# Patient Record
Sex: Male | Born: 1970
Health system: Southern US, Community
[De-identification: ages and names within clinical notes are randomized; demographics above are authoritative.]

## PROBLEM LIST (undated history)

## (undated) DIAGNOSIS — G43909 Migraine, unspecified, not intractable, without status migrainosus: Secondary | ICD-10-CM

---

## 2010-12-20 ENCOUNTER — Emergency Department (INDEPENDENT_AMBULATORY_CARE_PROVIDER_SITE_OTHER): Payer: Self-pay

## 2010-12-20 ENCOUNTER — Other Ambulatory Visit: Payer: Self-pay

## 2010-12-20 ENCOUNTER — Emergency Department (HOSPITAL_BASED_OUTPATIENT_CLINIC_OR_DEPARTMENT_OTHER)
Admission: EM | Admit: 2010-12-20 | Discharge: 2010-12-20 | Disposition: A | Discharge status: Home / Self Care | Payer: Self-pay | Attending: Emergency Medicine | Admitting: Emergency Medicine

## 2010-12-20 ENCOUNTER — Encounter: Payer: Self-pay | Admitting: *Deleted

## 2010-12-20 DIAGNOSIS — R059 Cough, unspecified: Secondary | ICD-10-CM

## 2010-12-20 DIAGNOSIS — J40 Bronchitis, not specified as acute or chronic: Secondary | ICD-10-CM | POA: Insufficient documentation

## 2010-12-20 DIAGNOSIS — J45909 Unspecified asthma, uncomplicated: Secondary | ICD-10-CM | POA: Insufficient documentation

## 2010-12-20 DIAGNOSIS — R05 Cough: Secondary | ICD-10-CM

## 2010-12-20 DIAGNOSIS — J029 Acute pharyngitis, unspecified: Secondary | ICD-10-CM | POA: Insufficient documentation

## 2010-12-20 DIAGNOSIS — R509 Fever, unspecified: Secondary | ICD-10-CM

## 2010-12-20 MED ORDER — ALBUTEROL SULFATE HFA 108 (90 BASE) MCG/ACT IN AERS
2.0000 | INHALATION_SPRAY | RESPIRATORY_TRACT | Status: DC | PRN
  Administered 2010-12-20: 2 via RESPIRATORY_TRACT

## 2010-12-20 NOTE — ED Notes (Signed)
I met patient in waiting room. Patient stated he did not have pain, but that he thinks his head cold from last week has moved to his chest. Patient stated no arm pain and no tingling.

## 2010-12-20 NOTE — ED Provider Notes (Signed)
History     CSN: [id number] Arrival date & time: 12/20/2010  9:43 AM  Chief Complaint  Patient presents with  . URI    HPI  (Consider location/radiation/quality/duration/timing/severity/associated sxs/prior treatment)  HPI Pt reports 2 days of nasal congestion, drainage and sore throat which has settled in his chest. Complaining of cough, mild SOB, generalized body aches. Subjective fever at home yesterday none today. Has history of asthma with weather changes, usually worse in Spring time. No significant improvement with OTC meds.   Past Medical History  Diagnosis Date  . Asthma     History reviewed. No pertinent past surgical history.  No family history on file.  History  Substance Use Topics  . Smoking status: Never Smoker   . Smokeless tobacco: Never Used  . Alcohol Use: Yes     occassional      Review of Systems  Review of Systems All other systems reviewed and are negative except as noted in HPI.   Allergies  Review of patient's allergies indicates no known allergies.  Home Medications   Current Outpatient Rx  Name Route Sig Dispense Refill  . ASPIRIN EFFERVESCENT 325 MG PO TBEF Oral Take 325 mg by mouth every 6 (six) hours as needed.      . GUAIFENESIN 600 MG PO TB12 Oral Take 1,200 mg by mouth 2 (two) times daily.        Physical Exam    BP 119/82  Pulse 95  Temp(Src) 98.5 F (36.9 C) (Oral)  Resp 21  Ht 5\' 8"  (1.727 m)  Wt 230 lb (104.327 kg)  BMI 34.97 kg/m2  SpO2 100%  Physical Exam  Nursing note and vitals reviewed. Constitutional: He is oriented to person, place, and time. He appears well-developed and well-nourished.  HENT:  Head: Normocephalic and atraumatic.  Eyes: EOM are normal. Pupils are equal, round, and reactive to light.  Neck: Normal range of motion. Neck supple.  Cardiovascular: Normal rate, normal heart sounds and intact distal pulses.   Pulmonary/Chest: Effort normal and breath sounds normal. No respiratory distress. He  has no wheezes. He has no rales. He exhibits no tenderness.  Abdominal: Bowel sounds are normal. He exhibits no distension. There is no tenderness.  Musculoskeletal: Normal range of motion. He exhibits no edema and no tenderness.  Neurological: He is alert and oriented to person, place, and time. He has normal strength. No cranial nerve deficit or sensory deficit.  Skin: Skin is warm and dry. No rash noted.  Psychiatric: He has a normal mood and affect.    ED Course  Procedures (including critical care time)  Dg Chest 2 View  12/20/2010  *RADIOLOGY REPORT*  Clinical Data: Fever and cough.  Chest pain.  CHEST - 2 VIEW  Comparison: None.  Findings: Heart size and vascularity are normal and the lungs are clear except slight peribronchial thickening consistent with bronchitis.  No osseous abnormality.  IMPRESSION: Mild bronchitic changes.  Original Report Authenticated By: Gwynn Burly, M.D.     MDM Initial chief complaint at registration was chest pain, so EKG ordered prior to triage.   Date: 12/20/2010  Rate: 91  Rhythm: normal sinus rhythm  QRS Axis: normal  Intervals: normal  ST/T Wave abnormalities: normal  Conduction Disutrbances:none  Narrative Interpretation:   Old EKG Reviewed: none available  Symptoms consistent with viral bronchitis. CXR consistent with that as well. Normal vitals, given albuterol HFA and advised symptomatic care at home.  Charles B. Bernette Mayers, MD 12/20/10 1059

## 2010-12-20 NOTE — ED Notes (Signed)
Patient states he developed a head cold 2 days ago and now is having cold in his chest with body aches.  Using OTC meds with minimal relief.

## 2012-08-09 ENCOUNTER — Emergency Department (HOSPITAL_BASED_OUTPATIENT_CLINIC_OR_DEPARTMENT_OTHER)
Admission: EM | Admit: 2012-08-09 | Discharge: 2012-08-09 | Disposition: A | Discharge status: Home / Self Care | Payer: Self-pay | Attending: Emergency Medicine | Admitting: Emergency Medicine

## 2012-08-09 ENCOUNTER — Encounter (HOSPITAL_BASED_OUTPATIENT_CLINIC_OR_DEPARTMENT_OTHER): Payer: Self-pay | Admitting: *Deleted

## 2012-08-09 DIAGNOSIS — J45909 Unspecified asthma, uncomplicated: Secondary | ICD-10-CM | POA: Insufficient documentation

## 2012-08-09 DIAGNOSIS — L259 Unspecified contact dermatitis, unspecified cause: Secondary | ICD-10-CM

## 2012-08-09 DIAGNOSIS — L255 Unspecified contact dermatitis due to plants, except food: Secondary | ICD-10-CM | POA: Insufficient documentation

## 2012-08-09 DIAGNOSIS — L299 Pruritus, unspecified: Secondary | ICD-10-CM | POA: Insufficient documentation

## 2012-08-09 MED ORDER — DIPHENHYDRAMINE HCL 25 MG PO CAPS
25.0000 mg | ORAL_CAPSULE | Freq: Once | ORAL | Status: AC
  Administered 2012-08-09: 25 mg via ORAL
  Filled 2012-08-09: qty 1

## 2012-08-09 MED ORDER — PREDNISONE 10 MG PO TABS
60.0000 mg | ORAL_TABLET | Freq: Once | ORAL | Status: AC
  Administered 2012-08-09: 60 mg via ORAL
  Filled 2012-08-09: qty 1

## 2012-08-09 NOTE — ED Provider Notes (Signed)
History     CSN: [id number]  Arrival date & time 08/09/12  1941   First MD Initiated Contact with Patient 08/09/12 2116      Chief Complaint  Patient presents with  . Insect Bite    (Consider location/radiation/quality/duration/timing/severity/associated sxs/prior treatment) HPI Patient wit rash and itching to left arm today noted this am on awakening.  Continues with redness although in same pattern, ie. Not spreading.  Reported as insect bite but saw no bug.  Patient has worked out in yard earlier this week.  No systemic systems, sob, weakness, vomiting or diarrhea.  Past Medical History  Diagnosis Date  . Asthma     History reviewed. No pertinent past surgical history.  History reviewed. No pertinent family history.  History  Substance Use Topics  . Smoking status: Never Smoker   . Smokeless tobacco: Never Used  . Alcohol Use: Yes     Comment: occassional      Review of Systems  All other systems reviewed and are negative.    Allergies  Review of patient's allergies indicates no known allergies.  Home Medications   Current Outpatient Rx  Name  Route  Sig  Dispense  Refill  . aspirin-sod bicarb-citric acid (ALKA-SELTZER) 325 MG TBEF   Oral   Take 325 mg by mouth every 6 (six) hours as needed.           Marland Kitchen guaiFENesin (MUCINEX) 600 MG 12 hr tablet   Oral   Take 1,200 mg by mouth 2 (two) times daily.             BP 133/76  Pulse 72  Temp(Src) 98.8 F (37.1 C) (Oral)  Resp 18  Ht 5\' 8"  (1.727 m)  Wt 245 lb (111.131 kg)  BMI 37.26 kg/m2  SpO2 98%  Physical Exam  Nursing note and vitals reviewed. Constitutional: He is oriented to person, place, and time. He appears well-developed and well-nourished.  HENT:  Head: Normocephalic and atraumatic.  Eyes: Conjunctivae are normal. Pupils are equal, round, and reactive to light.  Neck: Normal range of motion. Neck supple.  Cardiovascular: Normal rate, regular rhythm and normal heart sounds.    Pulmonary/Chest: Effort normal.  Abdominal: Soft.  Musculoskeletal: Normal range of motion.  Neurological: He is alert and oriented to person, place, and time.  Skin:  Erythematous, linear rash left form with some excoriation  Psychiatric: He has a normal mood and affect. His behavior is normal.    ED Course  Procedures (including critical care time)  Labs Reviewed - No data to display No results found.   No diagnosis found.    MDM  Symptoms c.w. Contact dermatitis specifically poison ivy.        Hilario Quarry, MD 08/09/12 2124

## 2012-08-09 NOTE — ED Notes (Signed)
Pt c/o ? Insect bite to left arm

## 2012-08-11 ENCOUNTER — Encounter (HOSPITAL_BASED_OUTPATIENT_CLINIC_OR_DEPARTMENT_OTHER): Payer: Self-pay | Admitting: *Deleted

## 2012-08-11 ENCOUNTER — Emergency Department (HOSPITAL_BASED_OUTPATIENT_CLINIC_OR_DEPARTMENT_OTHER)
Admission: EM | Admit: 2012-08-11 | Discharge: 2012-08-11 | Disposition: A | Discharge status: Home / Self Care | Payer: Self-pay | Attending: Emergency Medicine | Admitting: Emergency Medicine

## 2012-08-11 DIAGNOSIS — J45909 Unspecified asthma, uncomplicated: Secondary | ICD-10-CM | POA: Insufficient documentation

## 2012-08-11 DIAGNOSIS — Z79899 Other long term (current) drug therapy: Secondary | ICD-10-CM | POA: Insufficient documentation

## 2012-08-11 DIAGNOSIS — L259 Unspecified contact dermatitis, unspecified cause: Secondary | ICD-10-CM | POA: Insufficient documentation

## 2012-08-11 MED ORDER — PREDNISONE 20 MG PO TABS: 60.0000 mg | ORAL_TABLET | Freq: Every day | ORAL | Status: DC

## 2012-08-11 MED ORDER — PREDNISONE 10 MG PO TABS
60.0000 mg | ORAL_TABLET | Freq: Once | ORAL | Status: AC
  Administered 2012-08-11: 60 mg via ORAL
  Filled 2012-08-11: qty 1

## 2012-08-11 NOTE — ED Notes (Signed)
Pt states he was seen here Friday for poison ivy and now it is worse. No relief with Benadryl.

## 2012-08-11 NOTE — ED Provider Notes (Signed)
History    This chart was scribed for Yaniris Braddock B. Bernette Mayers, MD by Quintella Reichert, ED scribe.  This patient was seen in room MHT13/MHT13 and the patient's care was started at 8:46 PM.   CSN: [id number]  Arrival date & time 08/11/12  2001       Chief Complaint  Patient presents with  . Rash     The history is provided by the patient. No language interpreter was used.    HPI Comments: Adam Mcneil is a 42 y.o. male who presents to the Emergency Department complaining of rash to both forearms that began 2 days ago.  Pt was seen for the same symptoms that night in the ED and was given a dose of Benadryl and prednisone.  He was not given a prescription for prednisone.  Pt initially thought that symptoms were due to poison ivy but does not know of recent poison ivy exposure.  He also notes that he recently began working in [employer] and is routinely exposed to a large number of chemicals.  He denies recent changes in diet or cosmetic or hygiene products.  Pt denies rash in any other areas, swelling, fever, chills, nausea, emesis, diarrhea, weakness, numbness, or any other associated symptoms.   Past Medical History  Diagnosis Date  . Asthma     History reviewed. No pertinent past surgical history.  History reviewed. No pertinent family history.  History  Substance Use Topics  . Smoking status: Never Smoker   . Smokeless tobacco: Never Used  . Alcohol Use: Yes     Comment: occassional      Review of Systems A complete 10 system review of systems was obtained and all systems are negative except as noted in the HPI and PMH.    Allergies  Review of patient's allergies indicates no known allergies.  Home Medications   Current Outpatient Rx  Name  Route  Sig  Dispense  Refill  . aspirin-sod bicarb-citric acid (ALKA-SELTZER) 325 MG TBEF   Oral   Take 325 mg by mouth every 6 (six) hours as needed.           Marland Kitchen guaiFENesin (MUCINEX) 600 MG 12 hr tablet    Oral   Take 1,200 mg by mouth 2 (two) times daily.             BP 131/78  Pulse 68  Temp(Src) 98.2 F (36.8 C) (Oral)  Resp 20  Ht 5\' 8"  (1.727 m)  Wt 245 lb (111.131 kg)  BMI 37.26 kg/m2  SpO2 98%  Physical Exam  Nursing note and vitals reviewed. Constitutional: He is oriented to person, place, and time. He appears well-developed and well-nourished.  HENT:  Head: Normocephalic and atraumatic.  Neck: Neck supple.  Pulmonary/Chest: Effort normal.  Neurological: He is alert and oriented to person, place, and time. No cranial nerve deficit.  Skin: Skin is warm and dry. Rash (Contact dermatitis bilateral forearm) noted.  Psychiatric: He has a normal mood and affect. His behavior is normal.    ED Course  Procedures (including critical care time)  DIAGNOSTIC STUDIES: Oxygen Saturation is 98% on room air, normal by my interpretation.    COORDINATION OF CARE: 8:48 PM-Discussed treatment plan which includes prednisone prescription and OTC benadryl with pt at bedside and pt agreed to plan.      Labs Reviewed - No data to display No results found.   1. Contact dermatitis       MDM  Contact dermatitis, seen  for same 2 days ago, but not given Rx.    I personally performed the services described in this documentation, which was scribed in my presence. The recorded information has been reviewed and is accurate.    Katrine Radich B. Bernette Mayers, MD 08/12/12 [phone]

## 2012-08-11 NOTE — ED Notes (Signed)
MD with pt  

## 2013-04-30 ENCOUNTER — Emergency Department (HOSPITAL_BASED_OUTPATIENT_CLINIC_OR_DEPARTMENT_OTHER)
Admission: EM | Admit: 2013-04-30 | Discharge: 2013-05-01 | Disposition: A | Discharge status: Home / Self Care | Payer: BC Managed Care – PPO | Attending: Emergency Medicine | Admitting: Emergency Medicine

## 2013-04-30 ENCOUNTER — Encounter (HOSPITAL_BASED_OUTPATIENT_CLINIC_OR_DEPARTMENT_OTHER): Payer: Self-pay | Admitting: Emergency Medicine

## 2013-04-30 DIAGNOSIS — J45909 Unspecified asthma, uncomplicated: Secondary | ICD-10-CM | POA: Insufficient documentation

## 2013-04-30 DIAGNOSIS — IMO0002 Reserved for concepts with insufficient information to code with codable children: Secondary | ICD-10-CM | POA: Insufficient documentation

## 2013-04-30 DIAGNOSIS — M5416 Radiculopathy, lumbar region: Secondary | ICD-10-CM

## 2013-04-30 MED ORDER — METHOCARBAMOL 500 MG PO TABS: 500.0000 mg | ORAL_TABLET | Freq: Two times a day (BID) | ORAL | Status: DC | PRN

## 2013-04-30 MED ORDER — IBUPROFEN 600 MG PO TABS: 600.0000 mg | ORAL_TABLET | Freq: Four times a day (QID) | ORAL | Status: DC | PRN

## 2013-04-30 MED ORDER — HYDROCODONE-ACETAMINOPHEN 5-325 MG PO TABS: 2.0000 | ORAL_TABLET | ORAL | Status: DC | PRN

## 2013-04-30 MED ORDER — KETOROLAC TROMETHAMINE 60 MG/2ML IM SOLN
60.0000 mg | Freq: Once | INTRAMUSCULAR | Status: AC
  Administered 2013-04-30: 60 mg via INTRAMUSCULAR
  Filled 2013-04-30: qty 2

## 2013-04-30 NOTE — ED Notes (Signed)
Pt helping to move a sofa this evening and injured his back.  Denies any hx of back pain.

## 2013-04-30 NOTE — ED Provider Notes (Signed)
CSN: [id number]     Arrival date & time 04/30/13  2159 History  This chart was scribed for Loren Raceravid Jermy Couper, MD by Danella Maiersaroline Early, ED Scribe. This patient was seen in room MH01/MH01 and the patient's care was started at 11:03 PM.    Chief Complaint  Patient presents with  . Back Pain   Patient is a 43 y.o. male presenting with back pain. The history is provided by the patient. No language interpreter was used.  Back Pain Location:  Lumbar spine Pain severity:  Moderate Duration:  4 hours Timing:  Constant Associated symptoms: no abdominal pain, no fever, no numbness and no weakness    HPI Comments: Adam Mcneil is a 43 y.o. male who presents to the Emergency Department complaining of sudden-onset lower back pain that radiates to bilateral buttocks onset this evening around 7:30 while lifting a sofa from a bent position. He reports feeling a pop. He denies h/o back problems. He denies urinary or bowel incontinence. No fever or chills. No IV drug use. Pt is driving. Patient denies any numbness or weakness.   Past Medical History  Diagnosis Date  . Asthma    History reviewed. No pertinent past surgical history. No family history on file. History  Substance Use Topics  . Smoking status: Never Smoker   . Smokeless tobacco: Never Used  . Alcohol Use: Yes     Comment: occassional    Review of Systems  Constitutional: Negative for fever and chills.  Gastrointestinal: Negative for nausea, vomiting and abdominal pain.  Genitourinary: Negative for enuresis and difficulty urinating.  Musculoskeletal: Positive for back pain.  Skin: Negative for rash and wound.  Neurological: Negative for weakness and numbness.  All other systems reviewed and are negative.    Allergies  Review of patient's allergies indicates no known allergies.  Home Medications   Current Outpatient Rx  Name  Route  Sig  Dispense  Refill  . aspirin-sod bicarb-citric acid (ALKA-SELTZER) 325 MG TBEF   Oral  Take 325 mg by mouth every 6 (six) hours as needed.           Marland Kitchen. guaiFENesin (MUCINEX) 600 MG 12 hr tablet   Oral   Take 1,200 mg by mouth 2 (two) times daily.           . predniSONE (DELTASONE) 20 MG tablet   Oral   Take 3 tablets (60 mg total) by mouth daily.   15 tablet   0    BP 118/75  Pulse 74  Temp(Src) 98.2 F (36.8 C) (Oral)  Resp 18  Ht 5\' 8"  (1.727 m)  Wt 235 lb (106.595 kg)  BMI 35.74 kg/m2  SpO2 98% Physical Exam  Nursing note and vitals reviewed. Constitutional: He is oriented to person, place, and time. He appears well-developed and well-nourished. No distress.  HENT:  Head: Normocephalic and atraumatic.  Mouth/Throat: Oropharynx is clear and moist.  Eyes: EOM are normal. Pupils are equal, round, and reactive to light.  Neck: Normal range of motion. Neck supple.  Cardiovascular: Normal rate and regular rhythm.   Pulmonary/Chest: Effort normal.  Abdominal: Soft. Bowel sounds are normal.  Musculoskeletal: Normal range of motion. He exhibits tenderness. He exhibits no edema.  Tenderness to palpation in the midline upper lumbar region. No paraspinal tenderness. Patient has no step-offs or deformities. There is no evidence of any trauma. Patient has negative bilateral straight leg raise. He has no CVA tenderness.   Neurological: He is alert and oriented to person,  place, and time.  Patient is ambulatory in the emergency department. He has no numbness including no saddle anesthesia. He has 5/5 motor in all extremities. He has 2+ patellar DTRs bilaterally  Skin: Skin is warm and dry. No rash noted. No erythema.  Psychiatric: He has a normal mood and affect. His behavior is normal.    ED Course  Procedures (including critical care time) Medications - No data to display  DIAGNOSTIC STUDIES: Oxygen Saturation is 98% on RA, normal by my interpretation.    COORDINATION OF CARE: 11:14 PM- Discussed treatment plan with pt which includes prescriptions for pain  medication. Pt agrees to plan.    Labs Review Labs Reviewed - No data to display Imaging Review No results found.  EKG Interpretation   None       MDM  I personally performed the services described in this documentation, which was scribed in my presence. The recorded information has been reviewed and is accurate.  Patient history and physical exam consistent with radiculopathy likely due to disc disease. He has no concerning findings for cauda equina syndrome or infectious process. His neurological exam is normal. We'll treat symptomatically and refer to spine specialist if the symptoms do not improve. Patient has been given precautions to return for worsening pain, numbness, weakness, fever, loss of bowel or bladder control or for any concerns.  Loren Racer, MD 05/01/13 [phone]

## 2013-04-30 NOTE — Discharge Instructions (Signed)

## 2015-08-17 ENCOUNTER — Encounter (HOSPITAL_BASED_OUTPATIENT_CLINIC_OR_DEPARTMENT_OTHER): Payer: Self-pay | Admitting: *Deleted

## 2015-08-17 ENCOUNTER — Emergency Department (HOSPITAL_BASED_OUTPATIENT_CLINIC_OR_DEPARTMENT_OTHER)
Admission: EM | Admit: 2015-08-17 | Discharge: 2015-08-17 | Disposition: A | Discharge status: Home / Self Care | Payer: BLUE CROSS/BLUE SHIELD | Attending: Emergency Medicine | Admitting: Emergency Medicine

## 2015-08-17 DIAGNOSIS — R5383 Other fatigue: Secondary | ICD-10-CM | POA: Diagnosis present

## 2015-08-17 DIAGNOSIS — J45909 Unspecified asthma, uncomplicated: Secondary | ICD-10-CM | POA: Diagnosis not present

## 2015-08-17 LAB — URINALYSIS, ROUTINE W REFLEX MICROSCOPIC
Bilirubin Urine: NEGATIVE
Glucose, UA: NEGATIVE mg/dL
Hgb urine dipstick: NEGATIVE
Ketones, ur: NEGATIVE mg/dL
Leukocytes, UA: NEGATIVE
Nitrite: NEGATIVE
Protein, ur: NEGATIVE mg/dL
Specific Gravity, Urine: 1.025 (ref 1.005–1.030)
pH: 5.5 (ref 5.0–8.0)

## 2015-08-17 LAB — CBC WITH DIFFERENTIAL/PLATELET
Basophils Absolute: 0.1 10*3/uL (ref 0.0–0.1)
Basophils Relative: 1 %
Eosinophils Absolute: 0.2 10*3/uL (ref 0.0–0.7)
Eosinophils Relative: 3 %
HCT: 40.7 % (ref 39.0–52.0)
Hemoglobin: 13.5 g/dL (ref 13.0–17.0)
Lymphocytes Relative: 39 %
Lymphs Abs: 1.9 10*3/uL (ref 0.7–4.0)
MCH: 27.6 pg (ref 26.0–34.0)
MCHC: 33.2 g/dL (ref 30.0–36.0)
MCV: 83.1 fL (ref 78.0–100.0)
Monocytes Absolute: 0.4 10*3/uL (ref 0.1–1.0)
Monocytes Relative: 8 %
Neutro Abs: 2.3 10*3/uL (ref 1.7–7.7)
Neutrophils Relative %: 49 %
Platelets: 253 10*3/uL (ref 150–400)
RBC: 4.9 MIL/uL (ref 4.22–5.81)
RDW: 16.6 % — ABNORMAL HIGH (ref 11.5–15.5)
WBC: 4.8 10*3/uL (ref 4.0–10.5)

## 2015-08-17 LAB — BASIC METABOLIC PANEL
Anion gap: 6 (ref 5–15)
BUN: 15 mg/dL (ref 6–20)
CO2: 26 mmol/L (ref 22–32)
Calcium: 10.5 mg/dL — ABNORMAL HIGH (ref 8.9–10.3)
Chloride: 105 mmol/L (ref 101–111)
Creatinine, Ser: 1.4 mg/dL — ABNORMAL HIGH (ref 0.61–1.24)
GFR calc Af Amer: >60 mL/min (ref >60)
GFR calc non Af Amer: 58 mL/min — ABNORMAL LOW (ref >60)
Glucose, Bld: 101 mg/dL — ABNORMAL HIGH (ref 65–99)
Potassium: 4.2 mmol/L (ref 3.5–5.1)
Sodium: 137 mmol/L (ref 135–145)

## 2015-08-17 LAB — CBG MONITORING, ED: Glucose-Capillary: 122 mg/dL — ABNORMAL HIGH (ref 65–99)

## 2015-08-17 NOTE — Discharge Instructions (Signed)
There does not appear to be an emergent cause for your symptoms at this time. Your exam and labs were reassuring. Please follow-up with your doctor or Dr. Abner GreenspanBlyth to establish care. Return to ED for new or worsening symptoms.  Fatigue Fatigue is feeling tired all of the time, a lack of energy, or a lack of motivation. Occasional or mild fatigue is often a normal response to activity or life in general. However, long-lasting (chronic) or extreme fatigue may indicate an underlying medical condition. HOME CARE INSTRUCTIONS  Watch your fatigue for any changes. The following actions may help to lessen any discomfort you are feeling:  Talk to your health care provider about how much sleep you need each night. Try to get the required amount every night.  Take medicines only as directed by your health care provider.  Eat a healthy and nutritious diet. Ask your health care provider if you need help changing your diet.  Drink enough fluid to keep your urine clear or pale yellow.  Practice ways of relaxing, such as yoga, meditation, massage therapy, or acupuncture.  Exercise regularly.   Change situations that cause you stress. Try to keep your work and personal routine reasonable.  Do not abuse illegal drugs.  Limit alcohol intake to no more than 1 drink per day for nonpregnant women and 2 drinks per day for men. One drink equals 12 ounces of beer, 5 ounces of wine, or 1 ounces of hard liquor.  Take a multivitamin, if directed by your health care provider. SEEK MEDICAL CARE IF:   Your fatigue does not get better.  You have a fever.   You have unintentional weight loss or gain.  You have headaches.   You have difficulty:   Falling asleep.  Sleeping throughout the night.  You feel angry, guilty, anxious, or sad.   You are unable to have a bowel movement (constipation).   You skin is dry.   Your legs or another part of your body is swollen.  SEEK IMMEDIATE MEDICAL CARE  IF:   You feel confused.   Your vision is blurry.  You feel faint or pass out.   You have a severe headache.   You have severe abdominal, pelvic, or back pain.   You have chest pain, shortness of breath, or an irregular or fast heartbeat.   You are unable to urinate or you urinate less than normal.   You develop abnormal bleeding, such as bleeding from the rectum, vagina, nose, lungs, or nipples.  You vomit blood.   You have thoughts about harming yourself or committing suicide.   You are worried that you might harm someone else.    This information is not intended to replace advice given to you by your health care provider. Make sure you discuss any questions you have with your health care provider.   Document Released: 01/08/2007 Document Revised: 04/03/2014 Document Reviewed: 07/15/2013 Elsevier Interactive Patient Education [employer]2016 [employer] Inc[employer]

## 2015-08-17 NOTE — ED Notes (Signed)
Fatigue that gets worse after eating. Symptoms x 2 days.

## 2015-08-17 NOTE — ED Provider Notes (Signed)
CSN: [id number]     Arrival date & time 08/17/15  1419 History   First MD Initiated Contact with Patient 08/17/15 1442     Chief Complaint  Patient presents with  . Fatigue     (Consider location/radiation/quality/duration/timing/severity/associated sxs/prior Treatment) HPI Adam Mcneil is a 45 y.o. male history of asthma here for evaluation of fatigue. Patient reports he has been "a little more tired over the past 2 days". He reports his discomfort is worse after eating. He denies any overt headache, chest pain, shortness of breath, leg swelling, diaphoresis, abdominal pain, nausea or vomiting, bloody or dark stool, subjective temperature changes. No recent travels, surgeries or immobilizations, new medications or foods. Nothing makes the problem better or worse. No other modifying factors.  Past Medical History  Diagnosis Date  . Asthma    History reviewed. No pertinent past surgical history. No family history on file. Social History  Substance Use Topics  . Smoking status: Never Smoker   . Smokeless tobacco: Never Used  . Alcohol Use: Yes     Comment: occassional    Review of Systems A 10 point review of systems was completed and was negative except for pertinent positives and negatives as mentioned in the history of present illness     Allergies  Review of patient's allergies indicates no known allergies.  Home Medications   Prior to Admission medications   Medication Sig Start Date End Date Taking? Authorizing Provider  aspirin-sod bicarb-citric acid (ALKA-SELTZER) 325 MG TBEF Take 325 mg by mouth every 6 (six) hours as needed.      Historical Provider, MD  guaiFENesin (MUCINEX) 600 MG 12 hr tablet Take 1,200 mg by mouth 2 (two) times daily.      Historical Provider, MD  HYDROcodone-acetaminophen (NORCO) 5-325 MG per tablet Take 2 tablets by mouth every 4 (four) hours as needed. 04/30/13   Loren Racer, MD  ibuprofen (ADVIL,MOTRIN) 600 MG tablet Take 1 tablet (600  mg total) by mouth every 6 (six) hours as needed. 04/30/13   Loren Racer, MD  methocarbamol (ROBAXIN) 500 MG tablet Take 1 tablet (500 mg total) by mouth 2 (two) times daily as needed for muscle spasms. 04/30/13   Loren Racer, MD  predniSONE (DELTASONE) 20 MG tablet Take 3 tablets (60 mg total) by mouth daily. 08/11/12   Susy Frizzle, MD   BP 122/83 mmHg  Pulse 77  Temp(Src) 98.6 F (37 C) (Oral)  Resp 20  Ht  (1.727 m)  Wt 113.399 kg  BMI 38.02 kg/m2  SpO2 99% Physical Exam  Constitutional: He is oriented to person, place, and time. He appears well-developed and well-nourished.  Overall well-appearing after American male  HENT:  Head: Normocephalic and atraumatic.  Mouth/Throat: Oropharynx is clear and moist.  Eyes: Conjunctivae are normal. Pupils are equal, round, and reactive to light. Right eye exhibits no discharge. Left eye exhibits no discharge. No scleral icterus.  Neck: Normal range of motion. Neck supple.  Cardiovascular: Normal rate, regular rhythm and normal heart sounds.   Pulmonary/Chest: Effort normal and breath sounds normal. No respiratory distress. He has no wheezes. He has no rales.  Abdominal: Soft. There is no tenderness.  Musculoskeletal: He exhibits no tenderness.  Neurological: He is alert and oriented to person, place, and time.  Cranial Nerves II-XII grossly intact  Skin: Skin is warm and dry. No rash noted.  Psychiatric: He has a normal mood and affect.  Nursing note and vitals reviewed.   ED Course  Procedures (including critical care time) Labs Review Labs Reviewed  BASIC METABOLIC PANEL - Abnormal; Notable for the following:    Glucose, Bld 101 (*)    Creatinine, Ser 1.40 (*)    Calcium 10.5 (*)    GFR calc non Af Amer 58 (*)    All other components within normal limits  CBC WITH DIFFERENTIAL/PLATELET - Abnormal; Notable for the following:    RDW 16.6 (*)    All other components within normal limits  CBG MONITORING, ED - Abnormal;  Notable for the following:    Glucose-Capillary 122 (*)    All other components within normal limits  URINALYSIS, ROUTINE W REFLEX MICROSCOPIC (NOT AT [facility])  CBG MONITORING, ED    Imaging Review No results found. I have personally reviewed and evaluated these images and lab results as part of my medical decision-making.   EKG Interpretation None     Filed Vitals:   08/17/15 1422 08/17/15 1600  BP: 122/83 136/65  Pulse: 77 64  Temp: 98.6 F (37 C)   TempSrc: Oral   Resp: 20 18  Height: 5\' 8"  (1.727 m)   Weight: 113.399 kg   SpO2: 99% 96%    MDM  Overall well-appearing, here for evaluation of fatigue over the past 2 days. He is afebrile, hemodynamically stable. Overall appears well, nontoxic. Unremarkable physical exam. Screening labs are unremarkable, no evidence of urinary infection. We'll give PCP referral for follow-up. Discussed return precautions. No evidence of other acute or emergent pathology at this time. Final diagnoses:  Other fatigue      Joycie PeekBenjamin Brandin Stetzer, PA-C 08/17/15 1602  Lyndal Pulleyaniel Knott, MD 08/17/15 2147

## 2015-12-10 ENCOUNTER — Emergency Department (HOSPITAL_BASED_OUTPATIENT_CLINIC_OR_DEPARTMENT_OTHER): Payer: Self-pay

## 2015-12-10 ENCOUNTER — Encounter (HOSPITAL_BASED_OUTPATIENT_CLINIC_OR_DEPARTMENT_OTHER): Payer: Self-pay | Admitting: Emergency Medicine

## 2015-12-10 ENCOUNTER — Emergency Department (HOSPITAL_BASED_OUTPATIENT_CLINIC_OR_DEPARTMENT_OTHER)
Admission: EM | Admit: 2015-12-10 | Discharge: 2015-12-10 | Disposition: A | Discharge status: Home / Self Care | Payer: Self-pay | Attending: Emergency Medicine | Admitting: Emergency Medicine

## 2015-12-10 DIAGNOSIS — R519 Headache, unspecified: Secondary | ICD-10-CM

## 2015-12-10 DIAGNOSIS — J0100 Acute maxillary sinusitis, unspecified: Secondary | ICD-10-CM | POA: Insufficient documentation

## 2015-12-10 DIAGNOSIS — Z7982 Long term (current) use of aspirin: Secondary | ICD-10-CM | POA: Insufficient documentation

## 2015-12-10 DIAGNOSIS — R51 Headache: Secondary | ICD-10-CM

## 2015-12-10 DIAGNOSIS — Z79899 Other long term (current) drug therapy: Secondary | ICD-10-CM | POA: Insufficient documentation

## 2015-12-10 DIAGNOSIS — Z791 Long term (current) use of non-steroidal anti-inflammatories (NSAID): Secondary | ICD-10-CM | POA: Insufficient documentation

## 2015-12-10 DIAGNOSIS — J45909 Unspecified asthma, uncomplicated: Secondary | ICD-10-CM | POA: Insufficient documentation

## 2015-12-10 MED ORDER — SODIUM CHLORIDE 0.9 % IV BOLUS (SEPSIS)
500.0000 mL | Freq: Once | INTRAVENOUS | Status: AC
  Administered 2015-12-10: 500 mL via INTRAVENOUS

## 2015-12-10 MED ORDER — DEXAMETHASONE SODIUM PHOSPHATE 10 MG/ML IJ SOLN
10.0000 mg | Freq: Once | INTRAMUSCULAR | Status: AC
Start: 2015-12-10 — End: 2015-12-10
  Administered 2015-12-10: 10 mg via INTRAVENOUS
  Filled 2015-12-10: qty 1

## 2015-12-10 MED ORDER — METOCLOPRAMIDE HCL 5 MG/ML IJ SOLN
10.0000 mg | Freq: Once | INTRAMUSCULAR | Status: AC
  Administered 2015-12-10: 10 mg via INTRAVENOUS
  Filled 2015-12-10: qty 2

## 2015-12-10 MED ORDER — DIPHENHYDRAMINE HCL 50 MG/ML IJ SOLN
25.0000 mg | Freq: Once | INTRAMUSCULAR | Status: AC
Start: 2015-12-10 — End: 2015-12-10
  Administered 2015-12-10: 25 mg via INTRAVENOUS
  Filled 2015-12-10: qty 1

## 2015-12-10 MED ORDER — AMOXICILLIN-POT CLAVULANATE 875-125 MG PO TABS
1.0000 | ORAL_TABLET | Freq: Once | ORAL | Status: AC
  Administered 2015-12-10: 1 via ORAL
  Filled 2015-12-10: qty 1

## 2015-12-10 MED ORDER — NAPROXEN 500 MG PO TABS: 500.0000 mg | ORAL_TABLET | Freq: Two times a day (BID) | ORAL | 0 refills | Status: DC

## 2015-12-10 MED ORDER — AMOXICILLIN-POT CLAVULANATE 875-125 MG PO TABS: 1.0000 | ORAL_TABLET | Freq: Two times a day (BID) | ORAL | 0 refills | Status: DC

## 2015-12-10 MED ORDER — METHOCARBAMOL 1000 MG/10ML IJ SOLN
1000.0000 mg | Freq: Once | INTRAMUSCULAR | Status: AC
  Administered 2015-12-10: 1000 mg via INTRAVENOUS
  Filled 2015-12-10: qty 10

## 2015-12-10 MED ORDER — ACETAMINOPHEN 500 MG PO TABS
1000.0000 mg | ORAL_TABLET | Freq: Once | ORAL | Status: AC
  Administered 2015-12-10: 1000 mg via ORAL
  Filled 2015-12-10: qty 2

## 2015-12-10 NOTE — ED Notes (Signed)
MD with pt  

## 2015-12-10 NOTE — ED Triage Notes (Signed)
Pt reports headache onset x1 day constant pain radiating from head to neck also c/o photophobia.

## 2015-12-10 NOTE — ED Provider Notes (Signed)
[facility] Provider Note   CSN: [id number] Arrival date & time: 12/10/15  0145     History   Chief Complaint Chief Complaint  Patient presents with  . Headache    HPI Adam Mcneil is a 45 y.o. male.  The history is provided by the patient.  Headache   This is a recurrent problem. The current episode started yesterday. The problem occurs constantly. The problem has not changed since onset.The headache is associated with nothing. The pain is located in the bilateral and occipital region. The quality of the pain is described as dull. The pain is severe. The pain does not radiate. Pertinent negatives include no anorexia, no fever, no malaise/fatigue, no chest pressure, no orthopnea, no palpitations, no syncope, no shortness of breath, no nausea and no vomiting. He has tried aspirin for the symptoms. The treatment provided no relief.  No tick exposure.  No sudden onset.  No changes in vision or cognition.  Some nasal congestion.  No travel.  No neuro deficits.    Past Medical History:  Diagnosis Date  . Asthma     There are no active problems to display for this patient.   History reviewed. No pertinent surgical history.     Home Medications    Prior to Admission medications   Medication Sig Start Date End Date Taking? Authorizing Provider  aspirin-sod bicarb-citric acid (ALKA-SELTZER) 325 MG TBEF Take 325 mg by mouth every 6 (six) hours as needed.      Historical Provider, MD  guaiFENesin (MUCINEX) 600 MG 12 hr tablet Take 1,200 mg by mouth 2 (two) times daily.      Historical Provider, MD  HYDROcodone-acetaminophen (NORCO) 5-325 MG per tablet Take 2 tablets by mouth every 4 (four) hours as needed. 04/30/13   Loren Racer, MD  ibuprofen (ADVIL,MOTRIN) 600 MG tablet Take 1 tablet (600 mg total) by mouth every 6 (six) hours as needed. 04/30/13   Loren Racer, MD  methocarbamol (ROBAXIN) 500 MG tablet Take 1 tablet (500 mg total) by mouth 2 (two) times daily  as needed for muscle spasms. 04/30/13   Loren Racer, MD  predniSONE (DELTASONE) 20 MG tablet Take 3 tablets (60 mg total) by mouth daily. 08/11/12   Susy Frizzle, MD    Family History History reviewed. No pertinent family history.  Social History Social History  Substance Use Topics  . Smoking status: Never Smoker  . Smokeless tobacco: Never Used  . Alcohol use Yes     Comment: occassional     Allergies   Review of patient's allergies indicates no known allergies.   Review of Systems Review of Systems  Constitutional: Negative for fever and malaise/fatigue.  HENT: Positive for congestion. Negative for dental problem, facial swelling, postnasal drip, rhinorrhea, sore throat, trouble swallowing and voice change.   Eyes: Negative for photophobia and visual disturbance.  Respiratory: Negative for cough and shortness of breath.   Cardiovascular: Negative for palpitations, orthopnea and syncope.  Gastrointestinal: Negative for anorexia, nausea and vomiting.  Musculoskeletal: Negative for back pain and neck stiffness.  Skin: Negative for color change and rash.  Neurological: Positive for headaches. Negative for dizziness, tremors, seizures, syncope, facial asymmetry, speech difficulty, weakness, light-headedness and numbness.  All other systems reviewed and are negative.    Physical Exam Updated Vital Signs BP 114/61 (BP Location: Left Arm)   Pulse 75   Temp 98.4 F (36.9 C) (Oral)   Resp 18   Ht 5\' 9"  (1.753 m)  Wt 255 lb (115.7 kg)   SpO2 96%   BMI 37.66 kg/m   Physical Exam  Constitutional: He is oriented to person, place, and time. He appears well-developed and well-nourished. No distress.  HENT:  Head: Normocephalic and atraumatic.  Nose: Nose normal.  Mouth/Throat: No oropharyngeal exudate.  Eyes: Conjunctivae and EOM are normal. Pupils are equal, round, and reactive to light.  No proptosis  Neck: Normal range of motion, full passive range of motion  without pain and phonation normal. Neck supple. No neck rigidity. No Brudzinski's sign and no Kernig's sign noted. No thyromegaly present.  Cardiovascular: Normal rate, regular rhythm and intact distal pulses.   Pulmonary/Chest: Effort normal and breath sounds normal. No stridor. No respiratory distress. He has no wheezes. He has no rales.  Abdominal: Soft. Bowel sounds are normal. He exhibits no mass. There is no tenderness. There is no guarding.  Musculoskeletal: Normal range of motion.  Lymphadenopathy:    He has no cervical adenopathy.  Neurological: He is alert and oriented to person, place, and time. He has normal reflexes. He displays normal reflexes. No cranial nerve deficit. Coordination normal.  Intact cognition  Skin: Skin is warm and dry. Capillary refill takes less than 2 seconds.  Psychiatric: He has a normal mood and affect.     ED Treatments / Results   Vitals:   12/10/15 0156  BP: 114/61  Pulse: 75  Resp: 18  Temp: 98.4 F (36.9 C)   Results for orders placed or performed during the hospital encounter of 08/17/15  Urinalysis, Routine w reflex microscopic (not at [facility])  Result Value Ref Range   Color, Urine YELLOW YELLOW   APPearance CLEAR CLEAR   Specific Gravity, Urine 1.025 1.005 - 1.030   pH 5.5 5.0 - 8.0   Glucose, UA NEGATIVE NEGATIVE mg/dL   Hgb urine dipstick NEGATIVE NEGATIVE   Bilirubin Urine NEGATIVE NEGATIVE   Ketones, ur NEGATIVE NEGATIVE mg/dL   Protein, ur NEGATIVE NEGATIVE mg/dL   Nitrite NEGATIVE NEGATIVE   Leukocytes, UA NEGATIVE NEGATIVE  Basic metabolic panel  Result Value Ref Range   Sodium 137 135 - 145 mmol/L   Potassium 4.2 3.5 - 5.1 mmol/L   Chloride 105 101 - 111 mmol/L   CO2 26 22 - 32 mmol/L   Glucose, Bld 101 (H) 65 - 99 mg/dL   BUN 15 6 - 20 mg/dL   Creatinine, Ser 8.29 (H) 0.61 - 1.24 mg/dL   Calcium 56.2 (H) 8.9 - 10.3 mg/dL   GFR calc non Af Amer 58 (L) >60 mL/min   GFR calc Af Amer >60 >60 mL/min   Anion gap 6 5 - 15    CBC with Differential  Result Value Ref Range   WBC 4.8 4.0 - 10.5 K/uL   RBC 4.90 4.22 - 5.81 MIL/uL   Hemoglobin 13.5 13.0 - 17.0 g/dL   HCT 13.0 86.5 - 78.4 %   MCV 83.1 78.0 - 100.0 fL   MCH 27.6 26.0 - 34.0 pg   MCHC 33.2 30.0 - 36.0 g/dL   RDW 69.6 (H) 29.5 - 28.4 %   Platelets 253 150 - 400 K/uL   Neutrophils Relative % 49 %   Neutro Abs 2.3 1.7 - 7.7 K/uL   Lymphocytes Relative 39 %   Lymphs Abs 1.9 0.7 - 4.0 K/uL   Monocytes Relative 8 %   Monocytes Absolute 0.4 0.1 - 1.0 K/uL   Eosinophils Relative 3 %   Eosinophils Absolute 0.2 0.0 -  0.7 K/uL   Basophils Relative 1 %   Basophils Absolute 0.1 0.0 - 0.1 K/uL  CBG monitoring, ED  Result Value Ref Range   Glucose-Capillary 122 (H) 65 - 99 mg/dL   Ct Head Wo Contrast  Result Date: 12/10/2015 CLINICAL DATA:  Headache for 1 day.  Photosensitivity. EXAM: CT HEAD WITHOUT CONTRAST TECHNIQUE: Contiguous axial images were obtained from the base of the skull through the vertex without intravenous contrast. COMPARISON:  None. FINDINGS: Brain: No evidence of acute infarction, hemorrhage, hydrocephalus, extra-axial collection or mass lesion/mass effect. Vascular: No hyperdense vessel or unexpected calcification. Skull: Normal. Negative for fracture or focal lesion. Sinuses/Orbits: Mild membrane thickening of the left maxillary sinus, indeterminate chronicity. 15 mm retention cyst in the floor of the right maxillary antrum. Mild membrane thickening in the ethmoid air cells, particularly posteriorly on the right. IMPRESSION: 1. Normal brain 2. Paranasal sinus disease of indeterminate chronicity. Electronically Signed   By: Ellery Plunk M.D.   On: 12/10/2015 03:20     EKG  EKG Interpretation None       Radiology Ct Head Wo Contrast  Result Date: 12/10/2015 CLINICAL DATA:  Headache for 1 day.  Photosensitivity. EXAM: CT HEAD WITHOUT CONTRAST TECHNIQUE: Contiguous axial images were obtained from the base of the skull through the  vertex without intravenous contrast. COMPARISON:  None. FINDINGS: Brain: No evidence of acute infarction, hemorrhage, hydrocephalus, extra-axial collection or mass lesion/mass effect. Vascular: No hyperdense vessel or unexpected calcification. Skull: Normal. Negative for fracture or focal lesion. Sinuses/Orbits: Mild membrane thickening of the left maxillary sinus, indeterminate chronicity. 15 mm retention cyst in the floor of the right maxillary antrum. Mild membrane thickening in the ethmoid air cells, particularly posteriorly on the right. IMPRESSION: 1. Normal brain 2. Paranasal sinus disease of indeterminate chronicity. Electronically Signed   By: Ellery Plunk M.D.   On: 12/10/2015 03:20    Procedures Procedures (including critical care time)  Medications Ordered in ED Medications  sodium chloride 0.9 % bolus 500 mL (500 mLs Intravenous New Bag/Given 12/10/15 0242)  metoCLOPramide (REGLAN) injection 10 mg (10 mg Intravenous Given 12/10/15 0234)  diphenhydrAMINE (BENADRYL) injection 25 mg (25 mg Intravenous Given 12/10/15 0234)  methocarbamol (ROBAXIN) injection 1,000 mg (1,000 mg Intravenous Given 12/10/15 0242)  dexamethasone (DECADRON) injection 10 mg (10 mg Intravenous Given 12/10/15 0234)     Initial Impression / Assessment and Plan / ED Course  I have reviewed the triage vital signs and the nursing notes.  Pertinent labs & imaging results that were available during my care of the patient were reviewed by me and considered in my medical decision making (see chart for details).  Medications  sodium chloride 0.9 % bolus 500 mL (500 mLs Intravenous New Bag/Given 12/10/15 0242)  metoCLOPramide (REGLAN) injection 10 mg (10 mg Intravenous Given 12/10/15 0234)  diphenhydrAMINE (BENADRYL) injection 25 mg (25 mg Intravenous Given 12/10/15 0234)  methocarbamol (ROBAXIN) injection 1,000 mg (1,000 mg Intravenous Given 12/10/15 0242)  dexamethasone (DECADRON) injection 10 mg (10 mg Intravenous  Given 12/10/15 0234)   Sleeping soundly post medication.  Markedly improved.     Final Clinical Impressions(s) / ED Diagnoses  Will treat for sinus infection with Augmentin.  No indication for LP as well appearing with normal exam.   Final diagnoses:  None    New Prescriptions New Prescriptions   No medications on file  All questions answered to patient's satisfaction. Based on history and exam patient has been appropriately medically screened and emergency conditions excluded.  Patient is stable for discharge at this time. Follow up with your PMD for recheck in 2 days and strict return precautions given.  Return for fever, stiff neck, changes in vision, changes in cognition or any concerns.     Cy BlamerApril Qunicy Higinbotham, MD 12/10/15 [phone]

## 2015-12-10 NOTE — ED Notes (Addendum)
Returned from CT.

## 2016-01-29 ENCOUNTER — Emergency Department (HOSPITAL_BASED_OUTPATIENT_CLINIC_OR_DEPARTMENT_OTHER): Payer: BLUE CROSS/BLUE SHIELD

## 2016-01-29 ENCOUNTER — Encounter (HOSPITAL_BASED_OUTPATIENT_CLINIC_OR_DEPARTMENT_OTHER): Payer: Self-pay | Admitting: Emergency Medicine

## 2016-01-29 ENCOUNTER — Emergency Department (HOSPITAL_BASED_OUTPATIENT_CLINIC_OR_DEPARTMENT_OTHER)
Admission: EM | Admit: 2016-01-29 | Discharge: 2016-01-29 | Disposition: A | Discharge status: Home / Self Care | Payer: BLUE CROSS/BLUE SHIELD | Attending: Emergency Medicine | Admitting: Emergency Medicine

## 2016-01-29 DIAGNOSIS — R079 Chest pain, unspecified: Secondary | ICD-10-CM | POA: Diagnosis not present

## 2016-01-29 DIAGNOSIS — J45909 Unspecified asthma, uncomplicated: Secondary | ICD-10-CM | POA: Insufficient documentation

## 2016-01-29 DIAGNOSIS — R110 Nausea: Secondary | ICD-10-CM | POA: Diagnosis not present

## 2016-01-29 DIAGNOSIS — R1011 Right upper quadrant pain: Secondary | ICD-10-CM | POA: Diagnosis not present

## 2016-01-29 LAB — COMPREHENSIVE METABOLIC PANEL
ALT: 26 U/L (ref 17–63)
AST: 21 U/L (ref 15–41)
Albumin: 4.3 g/dL (ref 3.5–5.0)
Alkaline Phosphatase: 73 U/L (ref 38–126)
Anion gap: 5 (ref 5–15)
BUN: 9 mg/dL (ref 6–20)
CO2: 26 mmol/L (ref 22–32)
Calcium: 10.5 mg/dL — ABNORMAL HIGH (ref 8.9–10.3)
Chloride: 106 mmol/L (ref 101–111)
Creatinine, Ser: 1.36 mg/dL — ABNORMAL HIGH (ref 0.61–1.24)
GFR calc Af Amer: >60 mL/min (ref >60)
GFR calc non Af Amer: >60 mL/min (ref >60)
Glucose, Bld: 95 mg/dL (ref 65–99)
Potassium: 4 mmol/L (ref 3.5–5.1)
Sodium: 137 mmol/L (ref 135–145)
Total Bilirubin: 0.6 mg/dL (ref 0.3–1.2)
Total Protein: 7.3 g/dL (ref 6.5–8.1)

## 2016-01-29 LAB — CBC WITH DIFFERENTIAL/PLATELET
Basophils Absolute: 0.1 10*3/uL (ref 0.0–0.1)
Basophils Relative: 1 %
Eosinophils Absolute: 0.1 10*3/uL (ref 0.0–0.7)
Eosinophils Relative: 2 %
HCT: 40.4 % (ref 39.0–52.0)
Hemoglobin: 13.3 g/dL (ref 13.0–17.0)
Lymphocytes Relative: 41 %
Lymphs Abs: 2 10*3/uL (ref 0.7–4.0)
MCH: 27.9 pg (ref 26.0–34.0)
MCHC: 32.9 g/dL (ref 30.0–36.0)
MCV: 84.7 fL (ref 78.0–100.0)
Monocytes Absolute: 0.3 10*3/uL (ref 0.1–1.0)
Monocytes Relative: 7 %
Neutro Abs: 2.5 10*3/uL (ref 1.7–7.7)
Neutrophils Relative %: 49 %
Platelets: 258 10*3/uL (ref 150–400)
RBC: 4.77 MIL/uL (ref 4.22–5.81)
RDW: 15.3 % (ref 11.5–15.5)
WBC: 5 10*3/uL (ref 4.0–10.5)

## 2016-01-29 LAB — URINALYSIS, ROUTINE W REFLEX MICROSCOPIC
Bilirubin Urine: NEGATIVE
Glucose, UA: NEGATIVE mg/dL
Hgb urine dipstick: NEGATIVE
Ketones, ur: NEGATIVE mg/dL
Leukocytes, UA: NEGATIVE
Nitrite: NEGATIVE
Protein, ur: NEGATIVE mg/dL
Specific Gravity, Urine: 1.019 (ref 1.005–1.030)
pH: 5.5 (ref 5.0–8.0)

## 2016-01-29 LAB — LIPASE, BLOOD: Lipase: 30 U/L (ref 11–51)

## 2016-01-29 MED ORDER — MORPHINE SULFATE (PF) 4 MG/ML IV SOLN
4.0000 mg | Freq: Once | INTRAVENOUS | Status: AC
  Administered 2016-01-29: 4 mg via INTRAVENOUS
  Filled 2016-01-29: qty 1

## 2016-01-29 MED ORDER — SODIUM CHLORIDE 0.9 % IV BOLUS (SEPSIS)
1000.0000 mL | Freq: Once | INTRAVENOUS | Status: AC
  Administered 2016-01-29: 1000 mL via INTRAVENOUS

## 2016-01-29 NOTE — Discharge Instructions (Signed)
I recommend taking Tylenol and/or ibuprofen as prescribed over-the-counter as needed for pain relief. I recommend fluids to remain hydrated at home and eating a bland diet for the next few days until her symptoms have improved. Refrain from eating acidic foods or food/drinks with caffeine. Please follow up with a primary care provider from the Resource Guide provided below in one week if your symptoms have not improved. Please return to the Emergency Department if symptoms worsen or new onset of fever, chest pain, shortness of breath, new/worsening abdominal pain, vomiting, unable to keep fluids down, diarrhea, pain with urination, difficulty urinating, blood in urine or stool.

## 2016-01-29 NOTE — ED Notes (Signed)
Water provided to drink for fluid challenge.

## 2016-01-29 NOTE — ED Provider Notes (Signed)
[facility]   CSN: [id number] Arrival date & time: 01/29/16  1543  By signing my name below, I, Adam Mcneil, attest that this documentation has been prepared under the direction and in the presence of non-physician practitioner, Adam HakeNicole Nadeau, PA-C. Electronically Signed: Modena JanskyAlbert Mcneil, Scribe. 01/29/2016. 6:26 PM.  History   Chief Complaint Chief Complaint  Patient presents with  . Flank Pain        The history is provided by the patient. No language interpreter was used.   HPI Comments: Adam Mcneil is a 45 y.o. male who presents to the Emergency Department complaining of constant moderate right side pain that started one week ago. He describes the pain as sharp, non-radiating, and exacerbated by eating. He currently rates the gradually worsening pain as an 8/10. He reports associated nausea onset today. He denies any medication taken PTA, recent trauma, hx of abdominal surgeries, prior hx of pain, fever, SOB, chest pain, abdominal pain, vomiting, diarrhea, constipation, hematuria, dysuria, back pain or other complaints.   Past Medical History:  Diagnosis Date  . Asthma     There are no active problems to display for this patient.   History reviewed. No pertinent surgical history.     Home Medications    Prior to Admission medications   Medication Sig Start Date End Date Taking? Authorizing Provider  amoxicillin-clavulanate (AUGMENTIN) 875-125 MG tablet Take 1 tablet by mouth 2 (two) times daily. One po bid x 7 days 12/10/15   Adam Palumbo, MD  aspirin-sod bicarb-citric acid (ALKA-SELTZER) 325 MG TBEF Take 325 mg by mouth every 6 (six) hours as needed.      Historical Provider, MD  guaiFENesin (MUCINEX) 600 MG 12 hr tablet Take 1,200 mg by mouth 2 (two) times daily.      Historical Provider, MD  HYDROcodone-acetaminophen (NORCO) 5-325 MG per tablet Take 2 tablets by mouth every 4 (four) hours as needed. 04/30/13   Adam Raceravid Yelverton, MD    ibuprofen (ADVIL,MOTRIN) 600 MG tablet Take 1 tablet (600 mg total) by mouth every 6 (six) hours as needed. 04/30/13   Adam Raceravid Yelverton, MD  methocarbamol (ROBAXIN) 500 MG tablet Take 1 tablet (500 mg total) by mouth 2 (two) times daily as needed for muscle spasms. 04/30/13   Adam Raceravid Yelverton, MD  naproxen (NAPROSYN) 500 MG tablet Take 1 tablet (500 mg total) by mouth 2 (two) times daily. 12/10/15   Adam Palumbo, MD  predniSONE (DELTASONE) 20 MG tablet Take 3 tablets (60 mg total) by mouth daily. 08/11/12   Adam Frizzleharles Sheldon, MD    Family History History reviewed. No pertinent family history.  Social History Social History  Substance Use Topics  . Smoking status: Never Smoker  . Smokeless tobacco: Never Used  . Alcohol use Yes     Comment: occassional     Allergies   Patient has no known allergies.   Review of Systems Review of Systems  Constitutional: Negative for fever.  Respiratory: Negative for shortness of breath.   Cardiovascular: Negative for chest pain.  Gastrointestinal: Positive for nausea. Negative for constipation, diarrhea and vomiting.       Right side pain  Genitourinary: Negative for dysuria and hematuria.  All other systems reviewed and are negative.    Physical Exam Updated Vital Signs BP 119/86   Pulse 77   Temp 98.7 F (37.1 C)   Resp 20   Ht 5\' 8"  (1.727 m)   Wt 250 lb (113.4 kg)   SpO2 96%  BMI 38.01 kg/m   Physical Exam  Constitutional: He is oriented to person, place, and time. He appears well-developed and well-nourished. No distress.  HENT:  Head: Normocephalic and atraumatic.  Mouth/Throat: Uvula is midline, oropharynx is clear and moist and mucous membranes are normal. No oropharyngeal exudate, posterior oropharyngeal edema, posterior oropharyngeal erythema or tonsillar abscesses. No tonsillar exudate.  Eyes: Conjunctivae and EOM are normal. Right eye exhibits no discharge. Left eye exhibits no discharge. No scleral icterus.  Neck: Normal  range of motion. Neck supple.  Cardiovascular: Normal rate, regular rhythm, normal heart sounds and intact distal pulses.   Pulmonary/Chest: Effort normal and breath sounds normal. No respiratory distress. He has no wheezes. He has no rales. He exhibits tenderness (Right inferior lateral ribs are TTP.). He exhibits no mass, no laceration, no crepitus, no edema, no deformity, no swelling and no retraction.  Abdominal: Soft. Bowel sounds are normal. He exhibits no distension and no mass. There is no tenderness. There is no rigidity, no rebound, no guarding, no CVA tenderness, no tenderness at McBurney's point and negative Murphy's sign. No hernia.  Musculoskeletal: Normal range of motion. He exhibits no edema.  Neurological: He is alert and oriented to person, place, and time.  Skin: Skin is warm and dry. He is not diaphoretic.  Nursing note and vitals reviewed.    ED Treatments / Results  DIAGNOSTIC STUDIES: Oxygen Saturation is 96% on RA, normal by my interpretation.    COORDINATION OF CARE: 6:30 PM- Pt advised of plan for treatment and pt agrees.  Labs (all labs ordered are listed, but only abnormal results are displayed) Labs Reviewed  COMPREHENSIVE METABOLIC PANEL - Abnormal; Notable for the following:       Result Value   Creatinine, Ser 1.36 (*)    Calcium 10.5 (*)    All other components within normal limits  URINALYSIS, ROUTINE W REFLEX MICROSCOPIC (NOT AT [facility])  CBC WITH DIFFERENTIAL/PLATELET  LIPASE, BLOOD    EKG  EKG Interpretation None       Radiology Dg Ribs Unilateral W/chest Right  Result Date: 01/29/2016 CLINICAL DATA:  Right lateral chest pain for 1 week.  No injury. EXAM: RIGHT RIBS AND CHEST - 3+ VIEW COMPARISON:  Chest radiograph of 12/20/2010. FINDINGS: Frontal view the chest and two views of right-sided ribs. Frontal view of the chest demonstrates midline trachea. Normal heart size and mediastinal contours. No pleural effusion or pneumothorax. Clear  lungs. Right-sided rib films demonstrate a radiographic marker over approximately the tenth posterior lateral right rib. No underlying focal osseous lesion or displaced rib fracture. IMPRESSION: No acute findings. Electronically Signed   By: Jeronimo Greaves M.D.   On: 01/29/2016 19:17    Procedures Procedures (including critical care time)  Medications Ordered in ED Medications  sodium chloride 0.9 % bolus 1,000 mL (0 mLs Intravenous Stopped 01/29/16 2107)  morphine 4 MG/ML injection 4 mg (4 mg Intravenous Given 01/29/16 1859)     Initial Impression / Assessment and Plan / ED Course  I have reviewed the triage vital signs and the nursing notes.  Pertinent labs & imaging results that were available during my care of the patient were reviewed by me and considered in my medical decision making (see chart for details).  Clinical Course     Patient presents with right side pain that has been present over the past week and notes is worse after eating. Denies any recent trauma or injury. Denies fever, vomiting, flank pain, urinary symptoms. Denies history  of abdominal surgeries. VSS. Exam revealed tenderness over right anterior lateral ribs with no obvious deformity or injury. No abdominal tenderness, no Murphy sign. Remaining exam unremarkable. Patient given IV fluids and pain meds. Labs and urine unremarkable. Right RIBS and chest x-ray unremarkable. On reevaluation patient reports his symptoms have significantly improved. I do not suspect kidney stones, pyelonephritis, cholecystitis. Pt able to tolerate PO. Discussed results and plan for discharge with patient. Plan to discharge patient home with symptomatic treatment and PCP follow-up. Discussed return precautions.  Final Clinical Impressions(s) / ED Diagnoses   Final diagnoses:  Right upper quadrant abdominal pain    New Prescriptions Discharge Medication List as of 01/29/2016  9:00 PM     I personally performed the services described in  this documentation, which was scribed in my presence. The recorded information has been reviewed and is accurate.     Satira Sarkicole Elizabeth PerleyNadeau, [place] JerseyPA-C 01/30/16 1608    Alvira MondayErin Schlossman, MD 01/31/16 1240

## 2016-01-29 NOTE — ED Triage Notes (Signed)
Pt in c/o localized pain to R lateral side of abd x 1 week and progressively worsening. States it is much worse after eating. Denis n/v/d. Pt alert, interactive, ambulatory in NAD.

## 2016-01-29 NOTE — ED Notes (Signed)
Patient transported to X-ray 

## 2016-04-22 ENCOUNTER — Encounter (HOSPITAL_BASED_OUTPATIENT_CLINIC_OR_DEPARTMENT_OTHER): Payer: Self-pay

## 2016-04-22 ENCOUNTER — Emergency Department (HOSPITAL_BASED_OUTPATIENT_CLINIC_OR_DEPARTMENT_OTHER)
Admission: EM | Admit: 2016-04-22 | Discharge: 2016-04-22 | Disposition: A | Discharge status: Home / Self Care | Payer: BLUE CROSS/BLUE SHIELD | Attending: Emergency Medicine | Admitting: Emergency Medicine

## 2016-04-22 DIAGNOSIS — X58XXXA Exposure to other specified factors, initial encounter: Secondary | ICD-10-CM | POA: Insufficient documentation

## 2016-04-22 DIAGNOSIS — Y929 Unspecified place or not applicable: Secondary | ICD-10-CM | POA: Insufficient documentation

## 2016-04-22 DIAGNOSIS — Y990 Civilian activity done for income or pay: Secondary | ICD-10-CM | POA: Diagnosis not present

## 2016-04-22 DIAGNOSIS — S0501XA Injury of conjunctiva and corneal abrasion without foreign body, right eye, initial encounter: Secondary | ICD-10-CM | POA: Diagnosis not present

## 2016-04-22 DIAGNOSIS — Z791 Long term (current) use of non-steroidal anti-inflammatories (NSAID): Secondary | ICD-10-CM | POA: Diagnosis not present

## 2016-04-22 DIAGNOSIS — Z79899 Other long term (current) drug therapy: Secondary | ICD-10-CM | POA: Diagnosis not present

## 2016-04-22 DIAGNOSIS — J45909 Unspecified asthma, uncomplicated: Secondary | ICD-10-CM | POA: Insufficient documentation

## 2016-04-22 DIAGNOSIS — Y9389 Activity, other specified: Secondary | ICD-10-CM | POA: Diagnosis not present

## 2016-04-22 MED ORDER — FLUORESCEIN SODIUM 0.6 MG OP STRP
1.0000 | ORAL_STRIP | Freq: Once | OPHTHALMIC | Status: AC
Start: 2016-04-22 — End: 2016-04-22
  Administered 2016-04-22: 1 via OPHTHALMIC
  Filled 2016-04-22: qty 1

## 2016-04-22 MED ORDER — TETRACAINE HCL 0.5 % OP SOLN
1.0000 [drp] | Freq: Once | OPHTHALMIC | Status: AC
  Administered 2016-04-22: 1 [drp] via OPHTHALMIC
  Filled 2016-04-22: qty 4

## 2016-04-22 MED ORDER — ERYTHROMYCIN 5 MG/GM OP OINT: TOPICAL_OINTMENT | OPHTHALMIC | 0 refills | Status: DC

## 2016-04-22 NOTE — ED Triage Notes (Signed)
Pt reports right eye irritation and draining since working this morning. Pt was at work and was wearing goggles at the time.

## 2016-04-22 NOTE — ED Provider Notes (Signed)
[facility] Provider Note   CSN: [id number] Arrival date & time: 04/22/16  1346  By signing my name below, I, Teofilo Pod, attest that this documentation has been prepared under the direction and in the presence of [employer], PA-C. Electronically Signed: Teofilo Pod, ED Scribe. 04/22/2016. 4:02 PM.    History   Chief Complaint Chief Complaint  Patient presents with  . Eye Drainage   The history is provided by the patient. No language interpreter was used.   HPI Comments:  Adam Mcneil is a 46 y.o. male who presents to the Emergency Department complaining of constant right eye drainage and pain since this morning. Pt reports that this morning he was mixing rocks at work, and some small particles flew in to his eye while wearing goggles. He reports flushing out his right eye at work which provided mild, temporary relief for the pain. Pt states that his right eye is painful at this time and notes persistent clear drainage and blurred visioin. Pt does not wear corrective lenses. Pt denies loss of vision.  Past Medical History:  Diagnosis Date  . Asthma     There are no active problems to display for this patient.   History reviewed. No pertinent surgical history.     Home Medications    Prior to Admission medications   Medication Sig Start Date End Date Taking? Authorizing Provider  amoxicillin-clavulanate (AUGMENTIN) 875-125 MG tablet Take 1 tablet by mouth 2 (two) times daily. One po bid x 7 days 12/10/15   April Palumbo, MD  aspirin-sod bicarb-citric acid (ALKA-SELTZER) 325 MG TBEF Take 325 mg by mouth every 6 (six) hours as needed.      Historical Provider, MD  erythromycin ophthalmic ointment Place a 1/2 inch ribbon of ointment into the lower eyelid 4 times daily for 5 days 04/22/16   Eyvonne Mechanic, PA-C  guaiFENesin (MUCINEX) 600 MG 12 hr tablet Take 1,200 mg by mouth 2 (two) times daily.      Historical Provider, MD    HYDROcodone-acetaminophen (NORCO) 5-325 MG per tablet Take 2 tablets by mouth every 4 (four) hours as needed. 04/30/13   Loren Racer, MD  ibuprofen (ADVIL,MOTRIN) 600 MG tablet Take 1 tablet (600 mg total) by mouth every 6 (six) hours as needed. 04/30/13   Loren Racer, MD  methocarbamol (ROBAXIN) 500 MG tablet Take 1 tablet (500 mg total) by mouth 2 (two) times daily as needed for muscle spasms. 04/30/13   Loren Racer, MD  naproxen (NAPROSYN) 500 MG tablet Take 1 tablet (500 mg total) by mouth 2 (two) times daily. 12/10/15   April Palumbo, MD  predniSONE (DELTASONE) 20 MG tablet Take 3 tablets (60 mg total) by mouth daily. 08/11/12   Susy Frizzle, MD    Family History No family history on file.  Social History Social History  Substance Use Topics  . Smoking status: Never Smoker  . Smokeless tobacco: Never Used  . Alcohol use Yes     Comment: occassional     Allergies   Patient has no known allergies.   Review of Systems Review of Systems 10 Systems reviewed and are negative for acute change except as noted in the HPI.   Physical Exam Updated Vital Signs BP 132/73 (BP Location: Left Arm)   Pulse 74   Temp 98.3 F (36.8 C) (Oral)   Resp 20   Ht 5\' 8"  (1.727 m)   Wt 122.5 kg   SpO2 100%   BMI 41.05  kg/m   Physical Exam  Constitutional: He appears well-developed and well-nourished. No distress.  HENT:  Head: Normocephalic and atraumatic.  Eyes: Conjunctivae are normal.  Pupils equal round and reactive to light bilateral. Slight blurring vision on the right. Watery discharge noted. Bulbar injection noted right. Extraocular movements are intact. Fluorescein stain shows uptake over the 5:00 position. PH approximately 7 equal bilateral  Cardiovascular: Normal rate.   Pulmonary/Chest: Effort normal.  Abdominal: He exhibits no distension.  Neurological: He is alert.  Skin: Skin is warm and dry.  Psychiatric: He has a normal mood and affect.  Nursing note and  vitals reviewed.    ED Treatments / Results  DIAGNOSTIC STUDIES:  Oxygen Saturation is 100% on RA, normal by my interpretation.    COORDINATION OF CARE:  4:02 PM Discussed treatment plan with pt at bedside and pt agreed to plan.   Labs (all labs ordered are listed, but only abnormal results are displayed) Labs Reviewed - No data to display  EKG  EKG Interpretation None       Radiology No results found.  Procedures Procedures (including critical care time)  Medications Ordered in ED Medications  tetracaine (PONTOCAINE) 0.5 % ophthalmic solution 1 drop (1 drop Right Eye Given 04/22/16 1620)  fluorescein ophthalmic strip 1 strip (1 strip Right Eye Given 04/22/16 1620)     Initial Impression / Assessment and Plan / ED Course  I have reviewed the triage vital signs and the nursing notes.  Pertinent labs & imaging results that were available during my care of the patient were reviewed by me and considered in my medical decision making (see chart for details).   Labs:   Imaging: Joseph [employer] lamp  Consults:   Therapeutics: Tetracaine Fluoracaine   Discharge Meds: Erythromycin  Assessment/Plan:   46 year old male presents today with non penetrating corneal injury. Patient had a rock fly into his eye. He has 4 seen uptake on exam. Patient notes that these rocks are powdery chemicals, likely causing superficial corneal injury. Patient copiously irrigated the eye prior to evaluation. Patient's pH is normalized at the time of my evaluation. He does not wear contact lenses, he will be discharged home with erythromycin and close follow-up with ophthalmology. He is given strict return precautions, verbalized understanding and agreement to today's plan and had no further questions or concerns at time of discharge.    Final Clinical Impressions(s) / ED Diagnoses   Final diagnoses:  Corneal injury of right eye, initial encounter    New Prescriptions New Prescriptions    ERYTHROMYCIN OPHTHALMIC OINTMENT    Place a 1/2 inch ribbon of ointment into the lower eyelid 4 times daily for 5 days  I personally performed the services described in this documentation, which was scribed in my presence. The recorded information has been reviewed and is accurate.    Eyvonne MechanicJeffrey Ronni Osterberg, PA-C 04/22/16 1702    Eyvonne MechanicJeffrey Evah Rashid, PA-C 04/22/16 1705    Canary Brimhristopher J Tegeler, MD 04/23/16 1055

## 2016-04-22 NOTE — Discharge Instructions (Signed)
Please read attached information. If you experience any new or worsening signs or symptoms please return to the emergency room for evaluation. Please follow-up with your primary care provider or specialist as discussed. Please use medication prescribed only as directed and discontinue taking if you have any concerning signs or symptoms.   °

## 2016-04-24 DIAGNOSIS — S0501XA Injury of conjunctiva and corneal abrasion without foreign body, right eye, initial encounter: Secondary | ICD-10-CM | POA: Diagnosis not present

## 2016-05-29 DIAGNOSIS — R110 Nausea: Secondary | ICD-10-CM | POA: Diagnosis not present

## 2016-05-29 DIAGNOSIS — R1011 Right upper quadrant pain: Secondary | ICD-10-CM | POA: Diagnosis not present

## 2016-05-29 DIAGNOSIS — R197 Diarrhea, unspecified: Secondary | ICD-10-CM | POA: Diagnosis not present

## 2016-05-29 DIAGNOSIS — R6883 Chills (without fever): Secondary | ICD-10-CM | POA: Diagnosis not present

## 2016-05-29 DIAGNOSIS — R0602 Shortness of breath: Secondary | ICD-10-CM | POA: Diagnosis not present

## 2016-05-31 ENCOUNTER — Emergency Department (HOSPITAL_BASED_OUTPATIENT_CLINIC_OR_DEPARTMENT_OTHER)
Admission: EM | Admit: 2016-05-31 | Discharge: 2016-05-31 | Disposition: A | Discharge status: Home / Self Care | Payer: BLUE CROSS/BLUE SHIELD | Attending: Physician Assistant | Admitting: Physician Assistant

## 2016-05-31 ENCOUNTER — Emergency Department (HOSPITAL_BASED_OUTPATIENT_CLINIC_OR_DEPARTMENT_OTHER): Payer: BLUE CROSS/BLUE SHIELD

## 2016-05-31 ENCOUNTER — Encounter (HOSPITAL_BASED_OUTPATIENT_CLINIC_OR_DEPARTMENT_OTHER): Payer: Self-pay | Admitting: Emergency Medicine

## 2016-05-31 DIAGNOSIS — R1011 Right upper quadrant pain: Secondary | ICD-10-CM | POA: Diagnosis not present

## 2016-05-31 DIAGNOSIS — J45909 Unspecified asthma, uncomplicated: Secondary | ICD-10-CM | POA: Diagnosis not present

## 2016-05-31 DIAGNOSIS — Z791 Long term (current) use of non-steroidal anti-inflammatories (NSAID): Secondary | ICD-10-CM | POA: Diagnosis not present

## 2016-05-31 DIAGNOSIS — R109 Unspecified abdominal pain: Secondary | ICD-10-CM

## 2016-05-31 DIAGNOSIS — K760 Fatty (change of) liver, not elsewhere classified: Secondary | ICD-10-CM | POA: Diagnosis not present

## 2016-05-31 LAB — URINALYSIS, ROUTINE W REFLEX MICROSCOPIC
Bilirubin Urine: NEGATIVE
Glucose, UA: NEGATIVE mg/dL
Hgb urine dipstick: NEGATIVE
Ketones, ur: NEGATIVE mg/dL
Leukocytes, UA: NEGATIVE
Nitrite: NEGATIVE
Protein, ur: NEGATIVE mg/dL
Specific Gravity, Urine: 1.015 (ref 1.005–1.030)
pH: 6 (ref 5.0–8.0)

## 2016-05-31 LAB — CBC
HCT: 38.9 % — ABNORMAL LOW (ref 39.0–52.0)
Hemoglobin: 13.2 g/dL (ref 13.0–17.0)
MCH: 28.1 pg (ref 26.0–34.0)
MCHC: 33.9 g/dL (ref 30.0–36.0)
MCV: 82.8 fL (ref 78.0–100.0)
Platelets: 252 10*3/uL (ref 150–400)
RBC: 4.7 MIL/uL (ref 4.22–5.81)
RDW: 15.1 % (ref 11.5–15.5)
WBC: 5.6 10*3/uL (ref 4.0–10.5)

## 2016-05-31 LAB — BASIC METABOLIC PANEL
Anion gap: 5 (ref 5–15)
BUN: 12 mg/dL (ref 6–20)
CO2: 23 mmol/L (ref 22–32)
Calcium: 10.3 mg/dL (ref 8.9–10.3)
Chloride: 109 mmol/L (ref 101–111)
Creatinine, Ser: 1.39 mg/dL — ABNORMAL HIGH (ref 0.61–1.24)
GFR calc Af Amer: >60 mL/min (ref >60)
GFR calc non Af Amer: 59 mL/min — ABNORMAL LOW (ref >60)
Glucose, Bld: 121 mg/dL — ABNORMAL HIGH (ref 65–99)
Potassium: 3.8 mmol/L (ref 3.5–5.1)
Sodium: 137 mmol/L (ref 135–145)

## 2016-05-31 MED ORDER — CYCLOBENZAPRINE HCL 10 MG PO TABS: 10.0000 mg | ORAL_TABLET | Freq: Two times a day (BID) | ORAL | 0 refills | Status: DC | PRN

## 2016-05-31 MED ORDER — FENTANYL CITRATE (PF) 100 MCG/2ML IJ SOLN
50.0000 ug | INTRAMUSCULAR | Status: DC | PRN
  Administered 2016-05-31: 50 ug via INTRAVENOUS
  Filled 2016-05-31: qty 2

## 2016-05-31 MED ORDER — ONDANSETRON HCL 4 MG/2ML IJ SOLN
4.0000 mg | Freq: Once | INTRAMUSCULAR | Status: AC
  Administered 2016-05-31: 4 mg via INTRAVENOUS
  Filled 2016-05-31: qty 2

## 2016-05-31 MED ORDER — IBUPROFEN 800 MG PO TABS: 800.0000 mg | ORAL_TABLET | Freq: Three times a day (TID) | ORAL | 0 refills | Status: DC

## 2016-05-31 NOTE — Discharge Instructions (Signed)
We are unsure what is causing your pain today. We will have you use ibuprofen and Flexeril to help in case it is a muscle spasm. You should follow up with your primary care.  To find a primary care or specialty doctor please call [phone] or [phone] to access "[facility] Find a Doctor Service."  You may also go on the [facility] website at [email or web address]  There are also multiple [facility] and [facility] practices throughout the [place] that are frequently accepting new patients. You may find a clinic that is close to your home and contact them.  [facility] -  [address] [place] [phone]  [facility] in [place] (also locations in [place] and [place]) -  [address] [place] [address] [phone]  [facility] -  [address] [place] [address] [phone]

## 2016-05-31 NOTE — ED Triage Notes (Signed)
Pt c/o right flank pain x 2 days with nausea.  No hx of kidney stones

## 2016-05-31 NOTE — ED Notes (Signed)
Patient transported to Ultrasound 

## 2016-05-31 NOTE — ED Provider Notes (Signed)
[facility] Provider Note   CSN: [id number] Arrival date & time: 05/31/16  1807  By signing my name below, I, Modena Jansky, attest that this documentation has been prepared under the direction and in the presence of [place] [employer] Randall An, MD. Electronically Signed: Modena Jansky, Scribe. 05/31/2016. 6:56 PM.  History   Chief Complaint Chief Complaint  Patient presents with  . Flank Pain   The history is provided by the patient. No language interpreter was used.   HPI Comments: Adam Mcneil is a 46 y.o. male with a PMHx of asthma who presents to the Emergency Department complaining of intermittent moderate right flank pain that started about 2 days ago. He was seen in the ED on 01/29/16 for RUQ abdominal pain with no diagnosis. He states the pain came on in the afternoon. He describes the pain as exacerbated by eating and movement. He reports associated nausea. He denies any fever, vomiting, diarrhea, difficulty urinating, dysuria, hematuria, or other complaints.   Past Medical History:  Diagnosis Date  . Asthma     There are no active problems to display for this patient.   History reviewed. No pertinent surgical history.     Home Medications    Prior to Admission medications   Medication Sig Start Date End Date Taking? Authorizing Provider  amoxicillin-clavulanate (AUGMENTIN) 875-125 MG tablet Take 1 tablet by mouth 2 (two) times daily. One po bid x 7 days 12/10/15   April Palumbo, MD  aspirin-sod bicarb-citric acid (ALKA-SELTZER) 325 MG TBEF Take 325 mg by mouth every 6 (six) hours as needed.      Historical Provider, MD  cyclobenzaprine (FLEXERIL) 10 MG tablet Take 1 tablet (10 mg total) by mouth 2 (two) times daily as needed for muscle spasms. 05/31/16   Jamear Carbonneau Lyn Deamonte Sayegh, MD  erythromycin ophthalmic ointment Place a 1/2 inch ribbon of ointment into the lower eyelid 4 times daily for 5 days 04/22/16   Eyvonne Mechanic, PA-C  guaiFENesin (MUCINEX) 600 MG 12 hr  tablet Take 1,200 mg by mouth 2 (two) times daily.      Historical Provider, MD  HYDROcodone-acetaminophen (NORCO) 5-325 MG per tablet Take 2 tablets by mouth every 4 (four) hours as needed. 04/30/13   Loren Racer, MD  ibuprofen (ADVIL,MOTRIN) 600 MG tablet Take 1 tablet (600 mg total) by mouth every 6 (six) hours as needed. 04/30/13   Loren Racer, MD  ibuprofen (ADVIL,MOTRIN) 800 MG tablet Take 1 tablet (800 mg total) by mouth 3 (three) times daily. 05/31/16   Brynnley Dayrit Lyn Verna Desrocher, MD  methocarbamol (ROBAXIN) 500 MG tablet Take 1 tablet (500 mg total) by mouth 2 (two) times daily as needed for muscle spasms. 04/30/13   Loren Racer, MD  naproxen (NAPROSYN) 500 MG tablet Take 1 tablet (500 mg total) by mouth 2 (two) times daily. 12/10/15   April Palumbo, MD  predniSONE (DELTASONE) 20 MG tablet Take 3 tablets (60 mg total) by mouth daily. 08/11/12   Susy Frizzle, MD    Family History History reviewed. No pertinent family history.  Social History Social History  Substance Use Topics  . Smoking status: Never Smoker  . Smokeless tobacco: Never Used  . Alcohol use Yes     Comment: occassional     Allergies   Patient has no known allergies.   Review of Systems Review of Systems  Constitutional: Negative for fever.  Gastrointestinal: Positive for nausea. Negative for diarrhea and vomiting.  Genitourinary: Positive for flank pain (Right). Negative for difficulty  urinating, dysuria and hematuria.  All other systems reviewed and are negative.    Physical Exam Updated Vital Signs BP 134/78 (BP Location: Right Arm)   Pulse 88   Temp 98.9 F (37.2 C) (Oral)   Resp 20   Ht 5\' 8"  (1.727 m)   Wt 261 lb (118.4 kg)   SpO2 99%   BMI 39.68 kg/m   Physical Exam  Constitutional: He appears well-developed and well-nourished. No distress.  HENT:  Head: Normocephalic and atraumatic.  Eyes: Conjunctivae are normal.  Neck: Neck supple.  Cardiovascular: Normal rate.   Pulmonary/Chest:  Effort normal.  Abdominal: Soft. There is no tenderness.  Musculoskeletal: Normal range of motion.  Point tenderness of the right rib cage.   Neurological: He is alert.  Skin: Skin is warm and dry.  Psychiatric: He has a normal mood and affect.  Nursing note and vitals reviewed.    ED Treatments / Results  DIAGNOSTIC STUDIES: Oxygen Saturation is 99% on RA, normal by my interpretation.    COORDINATION OF CARE: 7:00 PM- Pt advised of plan for treatment and pt agrees.  Labs (all labs ordered are listed, but only abnormal results are displayed) Labs Reviewed  BASIC METABOLIC PANEL - Abnormal; Notable for the following:       Result Value   Glucose, Bld 121 (*)    Creatinine, Ser 1.39 (*)    GFR calc non Af Amer 59 (*)    All other components within normal limits  CBC - Abnormal; Notable for the following:    HCT 38.9 (*)    All other components within normal limits  URINALYSIS, ROUTINE W REFLEX MICROSCOPIC    EKG  EKG Interpretation None       Radiology Dg Chest 2 View  Result Date: 05/31/2016 CLINICAL DATA:  46 y/o  M; 2 days of moderate right flank pain. EXAM: CHEST  2 VIEW COMPARISON:  01/29/2016 chest radiograph. FINDINGS: Stable heart size and mediastinal contours are within normal limits. Both lungs are clear. No acute osseous abnormality is evident. IMPRESSION: No active cardiopulmonary disease. Electronically Signed   By: Mitzi HansenLance  Furusawa-Stratton M.D.   On: 05/31/2016 20:13   [place] Abdomen Limited Ruq  Result Date: 05/31/2016 CLINICAL DATA:  46 y/o  M; moderate right flank pain. EXAM: US ABDOMEN LIMITED - RIGHT UPPER QUADRANT COMPARISON:  08/30/2009 CT abdomen and pelvis. FINDINGS: Gallbladder: No gallstones or wall thickening visualized. No sonographic Murphy sign noted by sonographer. Common bile duct: Diameter: 2.7 mm Liver: Increased echogenicity of liver compatible with steatosis. Right inferior lobe of liver simple appearing cyst measuring 1.1 cm with similar  lucency on prior CT of abdomen and pelvis. IMPRESSION: 1. No acute process. 2. Hepatic steatosis. 3. Stable simple appearing right lobe hepatic cyst measuring 1.1 cm. Electronically Signed   By: Mitzi HansenLance  Furusawa-Stratton M.D.   On: 05/31/2016 20:16    Procedures Procedures (including critical care time)  Medications Ordered in ED Medications  fentaNYL (SUBLIMAZE) injection 50 mcg (50 mcg Intravenous Given 05/31/16 1857)  ondansetron (ZOFRAN) injection 4 mg (4 mg Intravenous Given 05/31/16 1857)     Initial Impression / Assessment and Plan / ED Course  I have reviewed the triage vital signs and the nursing notes.  Pertinent labs & imaging results that were available during my care of the patient were reviewed by me and considered in my medical decision making (see chart for details).     I personally performed the services described in this documentation, which was  scribed in my presence. The recorded information has been reviewed and is accurate.   Pt is a 46 yo male here with vague RUQ/right falnk/right back pain. Patient unabel to give good history- states it comes and goes, maybe worse with eating, sometimes worse with movmemetn.  No abdominal tenderness on exam. Does have point tendernesson Rlateral chest wall, but then patient states "it feels deeper".  Will get labs,RUQ and urine.   Patient appears well, normal vitals.   10:45 PM Ultrasound and a chest x-ray and labs are reassuring. We'll have him use ibuprofen and Flexeril as a muscle relaxant. We'll have him follow up as needed with his primary care physician.  Final Clinical Impressions(s) / ED Diagnoses   Final diagnoses:  RUQ pain  Flank pain    New Prescriptions New Prescriptions   CYCLOBENZAPRINE (FLEXERIL) 10 MG TABLET    Take 1 tablet (10 mg total) by mouth 2 (two) times daily as needed for muscle spasms.   IBUPROFEN (ADVIL,MOTRIN) 800 MG TABLET    Take 1 tablet (800 mg total) by mouth 3 (three) times daily.       Yeraldine Forney Randall An, MD 05/31/16 2245

## 2016-05-31 NOTE — ED Notes (Signed)
Pt reports he was seen here in November for this same pain.

## 2016-08-28 ENCOUNTER — Encounter (HOSPITAL_BASED_OUTPATIENT_CLINIC_OR_DEPARTMENT_OTHER): Payer: Self-pay | Admitting: Emergency Medicine

## 2016-08-28 ENCOUNTER — Emergency Department (HOSPITAL_BASED_OUTPATIENT_CLINIC_OR_DEPARTMENT_OTHER)
Admission: EM | Admit: 2016-08-28 | Discharge: 2016-08-28 | Disposition: A | Discharge status: Home / Self Care | Payer: BLUE CROSS/BLUE SHIELD | Attending: Emergency Medicine | Admitting: Emergency Medicine

## 2016-08-28 DIAGNOSIS — Z7982 Long term (current) use of aspirin: Secondary | ICD-10-CM | POA: Insufficient documentation

## 2016-08-28 DIAGNOSIS — J45909 Unspecified asthma, uncomplicated: Secondary | ICD-10-CM | POA: Insufficient documentation

## 2016-08-28 DIAGNOSIS — K529 Noninfective gastroenteritis and colitis, unspecified: Secondary | ICD-10-CM | POA: Diagnosis not present

## 2016-08-28 DIAGNOSIS — R1084 Generalized abdominal pain: Secondary | ICD-10-CM | POA: Diagnosis present

## 2016-08-28 LAB — CBC WITH DIFFERENTIAL/PLATELET
Basophils Absolute: 0.1 10*3/uL (ref 0.0–0.1)
Basophils Relative: 1 %
Eosinophils Absolute: 0.1 10*3/uL (ref 0.0–0.7)
Eosinophils Relative: 3 %
HCT: 41 % (ref 39.0–52.0)
Hemoglobin: 13.6 g/dL (ref 13.0–17.0)
Lymphocytes Relative: 34 %
Lymphs Abs: 1.4 10*3/uL (ref 0.7–4.0)
MCH: 28 pg (ref 26.0–34.0)
MCHC: 33.2 g/dL (ref 30.0–36.0)
MCV: 84.4 fL (ref 78.0–100.0)
Monocytes Absolute: 0.3 10*3/uL (ref 0.1–1.0)
Monocytes Relative: 7 %
Neutro Abs: 2.3 10*3/uL (ref 1.7–7.7)
Neutrophils Relative %: 55 %
Platelets: 282 10*3/uL (ref 150–400)
RBC: 4.86 MIL/uL (ref 4.22–5.81)
RDW: 15.4 % (ref 11.5–15.5)
WBC: 4.1 10*3/uL (ref 4.0–10.5)

## 2016-08-28 LAB — COMPREHENSIVE METABOLIC PANEL
ALT: 23 U/L (ref 17–63)
AST: 20 U/L (ref 15–41)
Albumin: 4 g/dL (ref 3.5–5.0)
Alkaline Phosphatase: 82 U/L (ref 38–126)
Anion gap: 7 (ref 5–15)
BUN: 13 mg/dL (ref 6–20)
CO2: 24 mmol/L (ref 22–32)
Calcium: 10.5 mg/dL — ABNORMAL HIGH (ref 8.9–10.3)
Chloride: 106 mmol/L (ref 101–111)
Creatinine, Ser: 1.36 mg/dL — ABNORMAL HIGH (ref 0.61–1.24)
GFR calc Af Amer: >60 mL/min (ref >60)
GFR calc non Af Amer: >60 mL/min (ref >60)
Glucose, Bld: 115 mg/dL — ABNORMAL HIGH (ref 65–99)
Potassium: 4.1 mmol/L (ref 3.5–5.1)
Sodium: 137 mmol/L (ref 135–145)
Total Bilirubin: 0.3 mg/dL (ref 0.3–1.2)
Total Protein: 7.2 g/dL (ref 6.5–8.1)

## 2016-08-28 LAB — LIPASE, BLOOD: Lipase: 30 U/L (ref 11–51)

## 2016-08-28 MED ORDER — DICYCLOMINE HCL 20 MG PO TABS: 20.0000 mg | ORAL_TABLET | Freq: Two times a day (BID) | ORAL | 0 refills | Status: DC

## 2016-08-28 MED ORDER — ONDANSETRON HCL 4 MG/2ML IJ SOLN
4.0000 mg | Freq: Once | INTRAMUSCULAR | Status: AC
  Administered 2016-08-28: 4 mg via INTRAVENOUS
  Filled 2016-08-28: qty 2

## 2016-08-28 MED ORDER — ONDANSETRON 4 MG PO TBDP: 4.0000 mg | ORAL_TABLET | Freq: Three times a day (TID) | ORAL | 0 refills | Status: DC | PRN

## 2016-08-28 MED ORDER — DICYCLOMINE HCL 10 MG/ML IM SOLN
20.0000 mg | Freq: Once | INTRAMUSCULAR | Status: AC
  Administered 2016-08-28: 20 mg via INTRAMUSCULAR
  Filled 2016-08-28: qty 2

## 2016-08-28 MED ORDER — SODIUM CHLORIDE 0.9 % IV BOLUS (SEPSIS)
1000.0000 mL | Freq: Once | INTRAVENOUS | Status: AC
  Administered 2016-08-28: 1000 mL via INTRAVENOUS

## 2016-08-28 NOTE — ED Notes (Signed)
ED Provider at bedside. 

## 2016-08-28 NOTE — ED Provider Notes (Signed)
[facility] Provider Note   CSN: [id number] Arrival date & time: 08/28/16  1008     History   Chief Complaint Chief Complaint  Patient presents with  . Diarrhea    HPI Adam Mcneil is a 46 y.o. male.  HPI   46 year old male with a history of asthma presents with concern for abdominal cramping and diarrhea. Patient reports that he ate an echo had on Saturday, and Sunday morning he began to develop diarrhea and abdominal cramping. Denies any black or bloody stools. Reports nausea, but denies vomiting. Reports having 3-4 episodes per day. Reports the abdominal pain is cramping and diffuse. Denies any recent antibiotic use. Does not know of anyone else being sick from the cookout. Denies urinary symptoms and fevers. Reports he has not been able to eat much as he feels it worsens his diarrhea.  Past Medical History:  Diagnosis Date  . Asthma     There are no active problems to display for this patient.   History reviewed. No pertinent surgical history.     Home Medications    Prior to Admission medications   Medication Sig Start Date End Date Taking? Authorizing Provider  amoxicillin-clavulanate (AUGMENTIN) 875-125 MG tablet Take 1 tablet by mouth 2 (two) times daily. One po bid x 7 days 12/10/15   Palumbo, April, MD  aspirin-sod bicarb-citric acid (ALKA-SELTZER) 325 MG TBEF Take 325 mg by mouth every 6 (six) hours as needed.      [provider]  cyclobenzaprine (FLEXERIL) 10 MG tablet Take 1 tablet (10 mg total) by mouth 2 (two) times daily as needed for muscle spasms. 05/31/16   Mackuen, Courteney Lyn, MD  dicyclomine (BENTYL) 20 MG tablet Take 1 tablet (20 mg total) by mouth 2 (two) times daily. 08/28/16   Alvira Monday, MD  erythromycin ophthalmic ointment Place a 1/2 inch ribbon of ointment into the lower eyelid 4 times daily for 5 days 04/22/16   Hedges, Tinnie Gens, PA-C  guaiFENesin (MUCINEX) 600 MG 12 hr tablet Take 1,200 mg by mouth 2 (two) times  daily.      [provider]  HYDROcodone-acetaminophen (NORCO) 5-325 MG per tablet Take 2 tablets by mouth every 4 (four) hours as needed. 04/30/13   Loren Racer, MD  ibuprofen (ADVIL,MOTRIN) 600 MG tablet Take 1 tablet (600 mg total) by mouth every 6 (six) hours as needed. 04/30/13   Loren Racer, MD  ibuprofen (ADVIL,MOTRIN) 800 MG tablet Take 1 tablet (800 mg total) by mouth 3 (three) times daily. 05/31/16   Mackuen, Courteney Lyn, MD  methocarbamol (ROBAXIN) 500 MG tablet Take 1 tablet (500 mg total) by mouth 2 (two) times daily as needed for muscle spasms. 04/30/13   Loren Racer, MD  naproxen (NAPROSYN) 500 MG tablet Take 1 tablet (500 mg total) by mouth 2 (two) times daily. 12/10/15   Palumbo, April, MD  ondansetron (ZOFRAN ODT) 4 MG disintegrating tablet Take 1 tablet (4 mg total) by mouth every 8 (eight) hours as needed for nausea or vomiting. 08/28/16   Alvira Monday, MD  predniSONE (DELTASONE) 20 MG tablet Take 3 tablets (60 mg total) by mouth daily. 08/11/12   Susy Frizzle, MD    Family History History reviewed. No pertinent family history.  Social History Social History  Substance Use Topics  . Smoking status: Never Smoker  . Smokeless tobacco: Never Used  . Alcohol use Yes     Comment: occassional     Allergies   Patient has no known  allergies.   Review of Systems Review of Systems  Constitutional: Negative for fever.  HENT: Negative for sore throat.   Eyes: Negative for visual disturbance.  Respiratory: Negative for shortness of breath.   Cardiovascular: Negative for chest pain.  Gastrointestinal: Positive for abdominal pain, diarrhea and nausea. Negative for vomiting.  Genitourinary: Negative for difficulty urinating.  Musculoskeletal: Negative for back pain and neck stiffness.  Skin: Negative for rash.  Neurological: Negative for syncope and headaches.     Physical Exam Updated Vital Signs BP 117/72 (BP Location: Left Arm)   Pulse (!) 59    Temp 98.1 F (36.7 C) (Oral)   Resp 18   Ht 5\' 8"  (1.727 m)   Wt 104.3 kg (230 lb)   SpO2 97%   BMI 34.97 kg/m   Physical Exam  Constitutional: He is oriented to person, place, and time. He appears well-developed and well-nourished. No distress.  HENT:  Head: Normocephalic and atraumatic.  Eyes: Conjunctivae and EOM are normal.  Neck: Normal range of motion.  Cardiovascular: Normal rate, regular rhythm, normal heart sounds and intact distal pulses.  Exam reveals no gallop and no friction rub.   No murmur heard. Pulmonary/Chest: Effort normal and breath sounds normal. No respiratory distress. He has no wheezes. He has no rales.  Abdominal: Soft. He exhibits no distension. There is tenderness (mild diffuse). There is no guarding.  Musculoskeletal: He exhibits no edema.  Neurological: He is alert and oriented to person, place, and time.  Skin: Skin is warm and dry. He is not diaphoretic.  Nursing note and vitals reviewed.    ED Treatments / Results  Labs (all labs ordered are listed, but only abnormal results are displayed) Labs Reviewed  COMPREHENSIVE METABOLIC PANEL - Abnormal; Notable for the following:       Result Value   Glucose, Bld 115 (*)    Creatinine, Ser 1.36 (*)    Calcium 10.5 (*)    All other components within normal limits  CBC WITH DIFFERENTIAL/PLATELET  LIPASE, BLOOD    EKG  EKG Interpretation None       Radiology No results found.  Procedures Procedures (including critical care time)  Medications Ordered in ED Medications  sodium chloride 0.9 % bolus 1,000 mL (0 mLs Intravenous Stopped 08/28/16 1245)  ondansetron (ZOFRAN) injection 4 mg (4 mg Intravenous Given 08/28/16 1125)  dicyclomine (BENTYL) injection 20 mg (20 mg Intramuscular Given 08/28/16 1125)     Initial Impression / Assessment and Plan / ED Course  I have reviewed the triage vital signs and the nursing notes.  Pertinent labs & imaging results that were available during my care of  the patient were reviewed by me and considered in my medical decision making (see chart for details).    46 year old male with a history of asthma presents with concern for nausea, diarrhea and abdominal cramping after eating at a cookout on Saturday night. Abdominal exam is benign with diffuse abdominal discomfort on exam, no focal tenderness, have low suspicion for appendicitis, diverticulitis or cholecystitis. We'll obtain lab work to evaluate for electrolyte abnormalities.  Labs show no significant abnormalities. Patient was given IV fluids, Zofran and Bentyl with improvement of symptoms. Recommended continued supportive care, hydration. Given prescription for Zofran and Bentyl. Patient discharged in stable condition with understanding of reasons to return.   Final Clinical Impressions(s) / ED Diagnoses   Final diagnoses:  Gastroenteritis    New Prescriptions Discharge Medication List as of 08/28/2016 12:53 PM  START taking these medications   Details  dicyclomine (BENTYL) 20 MG tablet Take 1 tablet (20 mg total) by mouth 2 (two) times daily., Starting Mon 08/28/2016, Print    ondansetron (ZOFRAN ODT) 4 MG disintegrating tablet Take 1 tablet (4 mg total) by mouth every 8 (eight) hours as needed for nausea or vomiting., Starting Mon 08/28/2016, Print         Alvira Monday, MD 08/28/16 2041

## 2016-08-28 NOTE — ED Triage Notes (Signed)
Patient states that he went to cook out on Saturday - on Sunday started to have abdominal cramping an loose stools.

## 2017-01-21 ENCOUNTER — Emergency Department (HOSPITAL_BASED_OUTPATIENT_CLINIC_OR_DEPARTMENT_OTHER)
Admission: EM | Admit: 2017-01-21 | Discharge: 2017-01-21 | Disposition: A | Discharge status: Home / Self Care | Payer: BLUE CROSS/BLUE SHIELD | Attending: Emergency Medicine | Admitting: Emergency Medicine

## 2017-01-21 ENCOUNTER — Emergency Department (HOSPITAL_BASED_OUTPATIENT_CLINIC_OR_DEPARTMENT_OTHER): Payer: BLUE CROSS/BLUE SHIELD

## 2017-01-21 ENCOUNTER — Encounter (HOSPITAL_BASED_OUTPATIENT_CLINIC_OR_DEPARTMENT_OTHER): Payer: Self-pay | Admitting: Emergency Medicine

## 2017-01-21 DIAGNOSIS — J45909 Unspecified asthma, uncomplicated: Secondary | ICD-10-CM | POA: Diagnosis not present

## 2017-01-21 DIAGNOSIS — Z79899 Other long term (current) drug therapy: Secondary | ICD-10-CM | POA: Insufficient documentation

## 2017-01-21 DIAGNOSIS — Z7982 Long term (current) use of aspirin: Secondary | ICD-10-CM | POA: Insufficient documentation

## 2017-01-21 DIAGNOSIS — S86912A Strain of unspecified muscle(s) and tendon(s) at lower leg level, left leg, initial encounter: Secondary | ICD-10-CM | POA: Insufficient documentation

## 2017-01-21 DIAGNOSIS — M25462 Effusion, left knee: Secondary | ICD-10-CM | POA: Diagnosis not present

## 2017-01-21 DIAGNOSIS — Y9389 Activity, other specified: Secondary | ICD-10-CM | POA: Insufficient documentation

## 2017-01-21 DIAGNOSIS — Y929 Unspecified place or not applicable: Secondary | ICD-10-CM | POA: Insufficient documentation

## 2017-01-21 DIAGNOSIS — X58XXXA Exposure to other specified factors, initial encounter: Secondary | ICD-10-CM | POA: Diagnosis not present

## 2017-01-21 DIAGNOSIS — S8392XA Sprain of unspecified site of left knee, initial encounter: Secondary | ICD-10-CM | POA: Diagnosis not present

## 2017-01-21 DIAGNOSIS — Y999 Unspecified external cause status: Secondary | ICD-10-CM | POA: Diagnosis not present

## 2017-01-21 DIAGNOSIS — S8992XA Unspecified injury of left lower leg, initial encounter: Secondary | ICD-10-CM | POA: Diagnosis not present

## 2017-01-21 MED ORDER — KETOROLAC TROMETHAMINE 60 MG/2ML IM SOLN
60.0000 mg | Freq: Once | INTRAMUSCULAR | Status: AC
  Administered 2017-01-21: 60 mg via INTRAMUSCULAR
  Filled 2017-01-21: qty 2

## 2017-01-21 MED ORDER — NAPROXEN 500 MG PO TABS: 500.0000 mg | ORAL_TABLET | Freq: Two times a day (BID) | ORAL | 0 refills | Status: DC

## 2017-01-21 NOTE — ED Notes (Signed)
Pt given d/c instructions as per chart. Rx x 1. Verbalizes understanding. No questions. 

## 2017-01-21 NOTE — ED Provider Notes (Signed)
[facility] EMERGENCY DEPARTMENT Provider Note   CSN: [id number] Arrival date & time: 01/21/17  2014     History   Chief Complaint Chief Complaint  Patient presents with  . Knee Pain    HPI Adam Mcneil is a 46 y.o. male.  The history is provided by the patient.  Knee Pain   This is a new problem. Episode onset: 1 week ago. The problem occurs constantly. The problem has been gradually worsening. The pain is present in the left knee. The quality of the pain is described as aching, pounding and constant. The pain is at a severity of 6/10. The pain is moderate. Associated symptoms include stiffness. The symptoms are aggravated by standing and activity. He has tried nothing for the symptoms. The treatment provided no relief. There has been a history of trauma (1 week before pain started he missed a step at work and jarred the knee and then pain and swelling started a week later). Family history is significant for no gout.    Past Medical History:  Diagnosis Date  . Asthma     There are no active problems to display for this patient.   History reviewed. No pertinent surgical history.     Home Medications    Prior to Admission medications   Medication Sig Start Date End Date Taking? Authorizing Provider  amoxicillin-clavulanate (AUGMENTIN) 875-125 MG tablet Take 1 tablet by mouth 2 (two) times daily. One po bid x 7 days 12/10/15   Palumbo, April, MD  aspirin-sod bicarb-citric acid (ALKA-SELTZER) 325 MG TBEF Take 325 mg by mouth every 6 (six) hours as needed.      [provider]  cyclobenzaprine (FLEXERIL) 10 MG tablet Take 1 tablet (10 mg total) by mouth 2 (two) times daily as needed for muscle spasms. 05/31/16   Mackuen, Courteney Lyn, MD  dicyclomine (BENTYL) 20 MG tablet Take 1 tablet (20 mg total) by mouth 2 (two) times daily. 08/28/16   Alvira MondaySchlossman, Erin, MD  erythromycin ophthalmic ointment Place a 1/2 inch ribbon of ointment into the lower eyelid 4  times daily for 5 days 04/22/16   Hedges, Tinnie GensJeffrey, PA-C  guaiFENesin (MUCINEX) 600 MG 12 hr tablet Take 1,200 mg by mouth 2 (two) times daily.      [provider]  HYDROcodone-acetaminophen (NORCO) 5-325 MG per tablet Take 2 tablets by mouth every 4 (four) hours as needed. 04/30/13   Loren RacerYelverton, David, MD  ibuprofen (ADVIL,MOTRIN) 600 MG tablet Take 1 tablet (600 mg total) by mouth every 6 (six) hours as needed. 04/30/13   Loren RacerYelverton, David, MD  ibuprofen (ADVIL,MOTRIN) 800 MG tablet Take 1 tablet (800 mg total) by mouth 3 (three) times daily. 05/31/16   Mackuen, Courteney Lyn, MD  methocarbamol (ROBAXIN) 500 MG tablet Take 1 tablet (500 mg total) by mouth 2 (two) times daily as needed for muscle spasms. 04/30/13   Loren RacerYelverton, David, MD  naproxen (NAPROSYN) 500 MG tablet Take 1 tablet (500 mg total) by mouth 2 (two) times daily. 01/21/17   Gwyneth SproutPlunkett, Granville Whitefield, MD  ondansetron (ZOFRAN ODT) 4 MG disintegrating tablet Take 1 tablet (4 mg total) by mouth every 8 (eight) hours as needed for nausea or vomiting. 08/28/16   Alvira MondaySchlossman, Erin, MD  predniSONE (DELTASONE) 20 MG tablet Take 3 tablets (60 mg total) by mouth daily. 08/11/12   Susy FrizzleSheldon, Charles, MD    Family History No family history on file.  Social History Social History  Substance Use Topics  . Smoking status: Never  Smoker  . Smokeless tobacco: Never Used  . Alcohol use Yes     Comment: occassional     Allergies   Patient has no known allergies.   Review of Systems Review of Systems  Musculoskeletal: Positive for stiffness.  All other systems reviewed and are negative.    Physical Exam Updated Vital Signs BP 114/61 (BP Location: Right Arm)   Pulse 65   Temp 98 F (36.7 C) (Oral)   Resp 20   Ht 5\' 8"  (1.727 m)   Wt 115.7 kg (255 lb)   SpO2 95%   BMI 38.77 kg/m   Physical Exam  Constitutional: He is oriented to person, place, and time. He appears well-developed and well-nourished. No distress.  HENT:  Head: Normocephalic  and atraumatic.  Cardiovascular: Normal rate.   Pulmonary/Chest: Effort normal.  Musculoskeletal: He exhibits tenderness.       Left knee: He exhibits swelling. He exhibits normal range of motion, no LCL laxity and no MCL laxity. Tenderness found. Medial joint line and lateral joint line tenderness noted.  Neurological: He is alert and oriented to person, place, and time.  Skin: Skin is warm. Capillary refill takes less than 2 seconds.  Psychiatric: He has a normal mood and affect. His behavior is normal.  Nursing note and vitals reviewed.    ED Treatments / Results  Labs (all labs ordered are listed, but only abnormal results are displayed) Labs Reviewed - No data to display  EKG  EKG Interpretation None       Radiology Dg Knee Complete 4 Views Left  Result Date: 01/21/2017 CLINICAL DATA:  Fall with twisting injury to the knee EXAM: LEFT KNEE - COMPLETE 4+ VIEW COMPARISON:  Report 08/08/2002 FINDINGS: No fracture or dislocation. Small joint effusion. Minimal patellofemoral degenerative changes. IMPRESSION: Small joint effusion.  No acute osseous abnormality Electronically Signed   By: Jasmine Pang M.D.   On: 01/21/2017 21:08    Procedures Procedures (including critical care time)  Medications Ordered in ED Medications  ketorolac (TORADOL) injection 60 mg (60 mg Intramuscular Given 01/21/17 2314)     Initial Impression / Assessment and Plan / ED Course  I have reviewed the triage vital signs and the nursing notes.  Pertinent labs & imaging results that were available during my care of the patient were reviewed by me and considered in my medical decision making (see chart for details).     Patient presenting overextending pain and swelling in the left knee after miss stepping 2 weeks ago.  He states initially significant pain for a week after missing a step when he was at work the knee started to swell and become painful.  He stands and walks a lot at work and has  continued his job but his symptoms are worsening.  He has not taken any medication for this pain.  He denies any prior knee injuries or surgeries.  Patient also states occasionally it feels like his knee will give out on him.  On exam he has medial and lateral joint line tenderness and swelling noted in that area.  X-ray shows knee effusion but no other acute findings.  Patient placed in a knee sleeve and given anti-inflammatories.  Given follow-up with sports medicine in 1 week if not improved.  Final Clinical Impressions(s) / ED Diagnoses   Final diagnoses:  Effusion of left knee  Strain of left knee, initial encounter    New Prescriptions Discharge Medication List as of 01/21/2017 11:01 PM  Gwyneth Sprout, MD 01/21/17 [phone]

## 2017-01-21 NOTE — ED Triage Notes (Signed)
Pt presents with c/o left knee pain after fall 3 weeks ago.

## 2017-01-21 NOTE — ED Notes (Signed)
Pt states he stepped off a step about three weeks ago and ?injured his knee. Difficulty ambulating. Moves toes. Feels touch. Cap refill < 3 sec.

## 2017-01-21 NOTE — Discharge Instructions (Signed)
Ice and elevate when you can.  Use crutches as needed.  Wear the knee sleeve anytime you are up and walking.

## 2017-01-21 NOTE — ED Notes (Signed)
ED Provider at bedside. 

## 2017-01-22 ENCOUNTER — Ambulatory Visit (INDEPENDENT_AMBULATORY_CARE_PROVIDER_SITE_OTHER): Payer: BLUE CROSS/BLUE SHIELD | Admitting: Family Medicine

## 2017-01-22 ENCOUNTER — Encounter: Payer: Self-pay | Admitting: Family Medicine

## 2017-01-22 DIAGNOSIS — S8992XA Unspecified injury of left lower leg, initial encounter: Secondary | ICD-10-CM

## 2017-01-22 NOTE — Patient Instructions (Signed)
Your knee exam is reassuring, consistent with severe knee contusion and synovitis, patellar tendon strain. Ice the knee 15 minutes at a time 3-4 times a day at least. ACE wrap for compression. Elevate above your heart as much as possible. Naproxen 500mg  twice a day with food for pain and inflammation. Knee extensions, straight leg raises 3 sets of 10 once a day. Add ankle weight if these become too easy. Crutches if needed to help get around. Light duty note provided - see that for details. Follow up with me in 1 month for reevaluation. We will consider physical therapy, MRI depending on how you're doing.

## 2017-01-23 DIAGNOSIS — M179 Osteoarthritis of knee, unspecified: Secondary | ICD-10-CM | POA: Insufficient documentation

## 2017-01-23 DIAGNOSIS — M1710 Unilateral primary osteoarthritis, unspecified knee: Secondary | ICD-10-CM | POA: Insufficient documentation

## 2017-01-23 DIAGNOSIS — S8992XD Unspecified injury of left lower leg, subsequent encounter: Secondary | ICD-10-CM | POA: Insufficient documentation

## 2017-01-23 NOTE — Progress Notes (Signed)
PCP: Patient, No Pcp Per  Subjective:   HPI: Patient is a 46 y.o. male here for left knee pain.  Patient reports about 3 weeks ago he twisted his left knee. Has had sharp pain anterior knee, feeling that this is giving out since then. Pain level is 7/10 and sharp. No prior injuries or problems with this knee. Using a knee sleeve. No skin changes, numbness.  Past Medical History:  Diagnosis Date  . Asthma     Current Outpatient Prescriptions on File Prior to Visit  Medication Sig Dispense Refill  . amoxicillin-clavulanate (AUGMENTIN) 875-125 MG tablet Take 1 tablet by mouth 2 (two) times daily. One po bid x 7 days 14 tablet 0  . aspirin-sod bicarb-citric acid (ALKA-SELTZER) 325 MG TBEF Take 325 mg by mouth every 6 (six) hours as needed.      . cyclobenzaprine (FLEXERIL) 10 MG tablet Take 1 tablet (10 mg total) by mouth 2 (two) times daily as needed for muscle spasms. 10 tablet 0  . dicyclomine (BENTYL) 20 MG tablet Take 1 tablet (20 mg total) by mouth 2 (two) times daily. 20 tablet 0  . erythromycin ophthalmic ointment Place a 1/2 inch ribbon of ointment into the lower eyelid 4 times daily for 5 days 1 g 0  . guaiFENesin (MUCINEX) 600 MG 12 hr tablet Take 1,200 mg by mouth 2 (two) times daily.      Marland Kitchen. HYDROcodone-acetaminophen (NORCO) 5-325 MG per tablet Take 2 tablets by mouth every 4 (four) hours as needed. 10 tablet 0  . ibuprofen (ADVIL,MOTRIN) 600 MG tablet Take 1 tablet (600 mg total) by mouth every 6 (six) hours as needed. 30 tablet 0  . ibuprofen (ADVIL,MOTRIN) 800 MG tablet Take 1 tablet (800 mg total) by mouth 3 (three) times daily. 21 tablet 0  . methocarbamol (ROBAXIN) 500 MG tablet Take 1 tablet (500 mg total) by mouth 2 (two) times daily as needed for muscle spasms. 20 tablet 0  . naproxen (NAPROSYN) 500 MG tablet Take 1 tablet (500 mg total) by mouth 2 (two) times daily. 30 tablet 0  . ondansetron (ZOFRAN ODT) 4 MG disintegrating tablet Take 1 tablet (4 mg total) by mouth  every 8 (eight) hours as needed for nausea or vomiting. 20 tablet 0  . predniSONE (DELTASONE) 20 MG tablet Take 3 tablets (60 mg total) by mouth daily. 15 tablet 0   No current facility-administered medications on file prior to visit.     No past surgical history on file.  No Known Allergies  Social History   Social History  . Marital status: Single    Spouse name: N/A  . Number of children: N/A  . Years of education: N/A   Occupational History  . Not on file.   Social History Main Topics  . Smoking status: Never Smoker  . Smokeless tobacco: Never Used  . Alcohol use Yes     Comment: occassional  . Drug use: No  . Sexual activity: Not on file   Other Topics Concern  . Not on file   Social History Narrative  . No narrative on file    No family history on file.  BP (!) 143/84   Pulse 83   Ht 5\' 8"  (1.727 m)   Wt 250 lb (113.4 kg)   BMI 38.01 kg/m   Review of Systems: See HPI above.     Objective:  Physical Exam:  Gen: NAD, comfortable in exam room  Left knee: Mild effusion.  No other  gross deformity, ecchymoses. Mild TTP patellar tendon.  No joint line, other tenderness. FROM. Negative ant/post drawers. Negative valgus/varus testing. Negative lachmanns. Negative mcmurrays, apleys, patellar apprehension. NV intact distally.  Right knee: No gross deformity, ecchymoses, swelling. No TTP. FROM. Negative ant/post drawers. Negative valgus/varus testing. NV intact distally.   Assessment & Plan:  1. Left knee injury - confirmed effusion and synovitis on ultrasound.  His exam is reassuring however.  We did discuss incidence of ACL tear with effusion despite normal exam.  Icing, ace wrap, elevation.  Naproxen twice a day.  Shown home exercises to do daily.  Crutches if needed.  Light duty.  F/u in 1 month.  Consider PT, MRI depending on his progress.

## 2017-01-23 NOTE — Assessment & Plan Note (Signed)
confirmed effusion and synovitis on ultrasound.  His exam is reassuring however.  We did discuss incidence of ACL tear with effusion despite normal exam.  Icing, ace wrap, elevation.  Naproxen twice a day.  Shown home exercises to do daily.  Crutches if needed.  Light duty.  F/u in 1 month.  Consider PT, MRI depending on his progress.

## 2017-01-31 ENCOUNTER — Ambulatory Visit: Payer: Self-pay | Admitting: Family Medicine

## 2017-02-22 ENCOUNTER — Ambulatory Visit: Payer: BLUE CROSS/BLUE SHIELD | Admitting: Family Medicine

## 2017-02-22 ENCOUNTER — Encounter: Payer: Self-pay | Admitting: Family Medicine

## 2017-02-22 DIAGNOSIS — S8992XD Unspecified injury of left lower leg, subsequent encounter: Secondary | ICD-10-CM | POA: Diagnosis not present

## 2017-02-22 MED ORDER — NAPROXEN 500 MG PO TABS: 500.0000 mg | ORAL_TABLET | Freq: Two times a day (BID) | ORAL | 1 refills | Status: DC

## 2017-02-22 NOTE — Patient Instructions (Signed)
We will go ahead with an MRI to assess for medial meniscus tear, possible ACL tear. Ice the knee 15 minutes at a time 3-4 times a day at least. ACE wrap for compression. Elevate above your heart as much as possible. Naproxen 500mg  twice a day with food for pain and inflammation - I sent more of this in for you. Knee extensions, straight leg raises 3 sets of 10 once a day. Add ankle weight if these become too easy. Follow up and further treatment will depend on the MRI.

## 2017-02-22 NOTE — Assessment & Plan Note (Signed)
Not improving as expected over past month.  Noted effusion and synovitis last visit.  Current exam concerning for medial meniscus tear.  Will continue naproxen, home exercises.  MRI to further assess.

## 2017-02-22 NOTE — Progress Notes (Signed)
PCP: Patient, No Pcp Per  Subjective:   HPI: Patient is a 46 y.o. male here for left knee pain.  10/29: Patient reports about 3 weeks ago he twisted his left knee. Has had sharp pain anterior knee, feeling that this is giving out since then. Pain level is 7/10 and sharp. No prior injuries or problems with this knee. Using a knee sleeve. No skin changes, numbness.  11/29: Patient returns reporting feeling about the same. Having an increase in popping, pain with walking and standing. Pain level currently a 3/10 anteriorly, medially and sharp. Taking naproxen though just ran out of this, also icing - both help. No skin changes, numbness.  Past Medical History:  Diagnosis Date  . Asthma     Current Outpatient Medications on File Prior to Visit  Medication Sig Dispense Refill  . aspirin-sod bicarb-citric acid (ALKA-SELTZER) 325 MG TBEF Take 325 mg by mouth every 6 (six) hours as needed.      . dicyclomine (BENTYL) 20 MG tablet Take 1 tablet (20 mg total) by mouth 2 (two) times daily. 20 tablet 0  . erythromycin ophthalmic ointment Place a 1/2 inch ribbon of ointment into the lower eyelid 4 times daily for 5 days 1 g 0  . methocarbamol (ROBAXIN) 500 MG tablet Take 1 tablet (500 mg total) by mouth 2 (two) times daily as needed for muscle spasms. 20 tablet 0  . ondansetron (ZOFRAN ODT) 4 MG disintegrating tablet Take 1 tablet (4 mg total) by mouth every 8 (eight) hours as needed for nausea or vomiting. 20 tablet 0   No current facility-administered medications on file prior to visit.     History reviewed. No pertinent surgical history.  No Known Allergies  Social History   Socioeconomic History  . Marital status: Single    Spouse name: Not on file  . Number of children: Not on file  . Years of education: Not on file  . Highest education level: Not on file  Social Needs  . Financial resource strain: Not on file  . Food insecurity - worry: Not on file  . Food insecurity -  inability: Not on file  . Transportation needs - medical: Not on file  . Transportation needs - non-medical: Not on file  Occupational History  . Not on file  Tobacco Use  . Smoking status: Never Smoker  . Smokeless tobacco: Never Used  Substance and Sexual Activity  . Alcohol use: Yes    Comment: occassional  . Drug use: No  . Sexual activity: Not on file  Other Topics Concern  . Not on file  Social History Narrative  . Not on file    History reviewed. No pertinent family history.  BP 139/89   Pulse 95   Ht 5\' 8"  (1.727 m)   Wt 250 lb (113.4 kg)   BMI 38.01 kg/m   Review of Systems: See HPI above.     Objective:  Physical Exam:  Gen: NAD, comfortable in exam room.  Left knee: No gross deformity, ecchymoses, swelling. TTP medial joint line.  No other tenderness currently. FROM with 5/5 strength. Negative ant/post drawers. Negative valgus/varus testing. Negative lachmanns. Positive mcmurrays, apleys, thessalys.  Negative sit home and patellar apprehension. NV intact distally.  Right knee: No gross deformity, ecchymoses, swelling. No TTP. FROM with 5/5 strength. Negative ant/post drawers. Negative valgus/varus testing.  NV intact distally.   Assessment & Plan:  1. Left knee injury - Not improving as expected over past month.  Noted  effusion and synovitis last visit.  Current exam concerning for medial meniscus tear.  Will continue naproxen, home exercises.  MRI to further assess.

## 2017-02-28 NOTE — Addendum Note (Signed)
Addended by: Kathi SimpersWISE, Nikeria Kalman F on: 02/28/2017 02:27 PM   Modules accepted: Orders

## 2017-03-03 ENCOUNTER — Ambulatory Visit (HOSPITAL_BASED_OUTPATIENT_CLINIC_OR_DEPARTMENT_OTHER): Payer: BLUE CROSS/BLUE SHIELD

## 2017-03-10 ENCOUNTER — Ambulatory Visit (HOSPITAL_BASED_OUTPATIENT_CLINIC_OR_DEPARTMENT_OTHER)
Admission: RE | Admit: 2017-03-10 | Discharge: 2017-03-10 | Disposition: A | Discharge status: Home / Self Care | Payer: BLUE CROSS/BLUE SHIELD | Source: Ambulatory Visit | Attending: Family Medicine | Admitting: Family Medicine

## 2017-03-10 DIAGNOSIS — M7652 Patellar tendinitis, left knee: Secondary | ICD-10-CM | POA: Insufficient documentation

## 2017-03-10 DIAGNOSIS — M25462 Effusion, left knee: Secondary | ICD-10-CM | POA: Diagnosis not present

## 2017-03-10 DIAGNOSIS — M1712 Unilateral primary osteoarthritis, left knee: Secondary | ICD-10-CM | POA: Diagnosis not present

## 2017-03-10 DIAGNOSIS — X58XXXD Exposure to other specified factors, subsequent encounter: Secondary | ICD-10-CM | POA: Insufficient documentation

## 2017-03-10 DIAGNOSIS — S8992XD Unspecified injury of left lower leg, subsequent encounter: Secondary | ICD-10-CM | POA: Diagnosis not present

## 2017-03-10 DIAGNOSIS — M25562 Pain in left knee: Secondary | ICD-10-CM | POA: Diagnosis not present

## 2017-03-13 ENCOUNTER — Ambulatory Visit: Payer: BLUE CROSS/BLUE SHIELD | Admitting: Family Medicine

## 2017-03-13 ENCOUNTER — Encounter: Payer: Self-pay | Admitting: Family Medicine

## 2017-03-13 DIAGNOSIS — S8992XD Unspecified injury of left lower leg, subsequent encounter: Secondary | ICD-10-CM

## 2017-03-13 MED ORDER — METHYLPREDNISOLONE ACETATE 40 MG/ML IJ SUSP
40.0000 mg | Freq: Once | INTRAMUSCULAR | Status: AC
  Administered 2017-03-13: 40 mg via INTRA_ARTICULAR

## 2017-03-13 NOTE — Patient Instructions (Signed)
Your pain is due to a flare of arthritis and patellofemoral syndrome (kneecap tracking issue) These are the different medications you can take for this: Tylenol 500mg  1-2 tabs three times a day for pain. Capsaicin, aspercreme, or biofreeze topically up to four times a day may also help with pain. Some supplements that may help for arthritis: Boswellia extract, curcumin, pycnogenol Aleve 1-2 tabs twice a day with food Cortisone injections are an option - you were given this today. If cortisone injections do not help, there are different types of shots that may help but they take longer to take effect. It's important that you continue to stay active. Straight leg raises, knee extensions 3 sets of 10 once a day (add ankle weight if these become too easy). Consider physical therapy to strengthen muscles around the joint that hurts to take pressure off of the joint itself. Shoe inserts with good arch support may be helpful. Heat or ice 15 minutes at a time 3-4 times a day as needed to help with pain. Water aerobics and cycling with low resistance are the best two types of exercise for arthritis though any exercise is ok as long as it doesn't worsen the pain. Give me an update around January 2nd (call or mychart message) - I suspect you'll be able to return without restrictions after this.

## 2017-03-13 NOTE — Progress Notes (Signed)
PCP: Patient, No Pcp Per  Subjective:   HPI: Patient is a 46 y.o. male here for left knee pain.  10/29: Patient reports about 3 weeks ago he twisted his left knee. Has had sharp pain anterior knee, feeling that this is giving out since then. Pain level is 7/10 and sharp. No prior injuries or problems with this knee. Using a knee sleeve. No skin changes, numbness.  11/29: Patient returns reporting feeling about the same. Having an increase in popping, pain with walking and standing. Pain level currently a 3/10 anteriorly, medially and sharp. Taking naproxen though just ran out of this, also icing - both help. No skin changes, numbness.  12/18: Patient reports his pain has worsened since last visit. Pain level 7/10 and sharp. Worse with any motions including walking, sitting, standing, stairs. Pain is mostly medial. Some swelling. No skin changes, numbness.  Past Medical History:  Diagnosis Date  . Asthma     Current Outpatient Medications on File Prior to Visit  Medication Sig Dispense Refill  . aspirin-sod bicarb-citric acid (ALKA-SELTZER) 325 MG TBEF Take 325 mg by mouth every 6 (six) hours as needed.      . dicyclomine (BENTYL) 20 MG tablet Take 1 tablet (20 mg total) by mouth 2 (two) times daily. 20 tablet 0  . erythromycin ophthalmic ointment Place a 1/2 inch ribbon of ointment into the lower eyelid 4 times daily for 5 days 1 g 0  . methocarbamol (ROBAXIN) 500 MG tablet Take 1 tablet (500 mg total) by mouth 2 (two) times daily as needed for muscle spasms. 20 tablet 0  . naproxen (NAPROSYN) 500 MG tablet Take 1 tablet (500 mg total) by mouth 2 (two) times daily. 60 tablet 1  . ondansetron (ZOFRAN ODT) 4 MG disintegrating tablet Take 1 tablet (4 mg total) by mouth every 8 (eight) hours as needed for nausea or vomiting. 20 tablet 0   No current facility-administered medications on file prior to visit.     History reviewed. No pertinent surgical history.  No Known  Allergies  Social History   Socioeconomic History  . Marital status: Single    Spouse name: Not on file  . Number of children: Not on file  . Years of education: Not on file  . Highest education level: Not on file  Social Needs  . Financial resource strain: Not on file  . Food insecurity - worry: Not on file  . Food insecurity - inability: Not on file  . Transportation needs - medical: Not on file  . Transportation needs - non-medical: Not on file  Occupational History  . Not on file  Tobacco Use  . Smoking status: Never Smoker  . Smokeless tobacco: Never Used  Substance and Sexual Activity  . Alcohol use: Yes    Comment: occassional  . Drug use: No  . Sexual activity: Not on file  Other Topics Concern  . Not on file  Social History Narrative  . Not on file    History reviewed. No pertinent family history.  BP 124/82   Pulse 72   Ht 5\' 8"  (1.727 m)   Wt 250 lb (113.4 kg)   BMI 38.01 kg/m   Review of Systems: See HPI above.     Objective:  Physical Exam:  Gen: NAD, comfortable in exam room.  Left knee: Minimal effusion.  No bruising, other deformity. TTP medial joint line.  Less tenderness post patellar facets, lateral joint line. FROM with 5/5 strength. Negative ant/post drawers.  Negative valgus/varus testing. Negative lachmanns. Pain with mcmurrays, apleys.  Negative patellar apprehension. NV intact distally.   Assessment & Plan:  1. Left knee injury - MRI reviewed and discussed with patient.  No evidence meniscus tear.  He has evidence of arthritis and fat pad impingement.  Home exercises.  Tylenol, topical medications, supplements, aleve reviewed.  Injection given today.  Consider physical therapy.  After informed written consent timeout was performed, patient was seated on exam table. Left knee was prepped with alcohol swab and utilizing anteromedial approach, patient's left knee was injected intraarticularly with 3:1 bupivicaine: depomedrol. Patient  tolerated the procedure well without immediate complications.

## 2017-03-13 NOTE — Assessment & Plan Note (Signed)
MRI reviewed and discussed with patient.  No evidence meniscus tear.  He has evidence of arthritis and fat pad impingement.  Home exercises.  Tylenol, topical medications, supplements, aleve reviewed.  Injection given today.  Consider physical therapy.  After informed written consent timeout was performed, patient was seated on exam table. Left knee was prepped with alcohol swab and utilizing anteromedial approach, patient's left knee was injected intraarticularly with 3:1 bupivicaine: depomedrol. Patient tolerated the procedure well without immediate complications.

## 2017-03-28 ENCOUNTER — Telehealth: Payer: Self-pay | Admitting: Family Medicine

## 2017-03-28 NOTE — Telephone Encounter (Signed)
Patient calling to follow up on left knee. He states he is feeling better. Patient would like a note for work stating he can return on 1/7 with no restrictions

## 2017-03-28 NOTE — Telephone Encounter (Signed)
Letter printed.

## 2017-04-02 ENCOUNTER — Telehealth: Payer: Self-pay | Admitting: Family Medicine

## 2017-04-02 NOTE — Telephone Encounter (Signed)
Extension is no longer needed. Patient came by the office and picked up the letter from 03/28/17

## 2017-04-02 NOTE — Telephone Encounter (Signed)
Patient calling regarding letter previously written on 1/2. Wants to know if he can get an extension to return to work to Thursday 1/10

## 2017-05-02 DIAGNOSIS — M791 Myalgia, unspecified site: Secondary | ICD-10-CM | POA: Diagnosis not present

## 2017-05-02 DIAGNOSIS — J111 Influenza due to unidentified influenza virus with other respiratory manifestations: Secondary | ICD-10-CM | POA: Diagnosis not present

## 2017-05-02 DIAGNOSIS — R51 Headache: Secondary | ICD-10-CM | POA: Diagnosis not present

## 2017-05-02 DIAGNOSIS — R509 Fever, unspecified: Secondary | ICD-10-CM | POA: Diagnosis not present

## 2017-07-28 DIAGNOSIS — R062 Wheezing: Secondary | ICD-10-CM | POA: Diagnosis not present

## 2017-07-28 DIAGNOSIS — R072 Precordial pain: Secondary | ICD-10-CM | POA: Diagnosis not present

## 2017-07-28 DIAGNOSIS — R509 Fever, unspecified: Secondary | ICD-10-CM | POA: Diagnosis not present

## 2017-07-28 DIAGNOSIS — R05 Cough: Secondary | ICD-10-CM | POA: Diagnosis not present

## 2017-07-28 DIAGNOSIS — J209 Acute bronchitis, unspecified: Secondary | ICD-10-CM | POA: Diagnosis not present

## 2017-07-28 DIAGNOSIS — R079 Chest pain, unspecified: Secondary | ICD-10-CM | POA: Diagnosis not present

## 2017-07-28 DIAGNOSIS — R0602 Shortness of breath: Secondary | ICD-10-CM | POA: Diagnosis not present

## 2017-07-30 DIAGNOSIS — R000 Tachycardia, unspecified: Secondary | ICD-10-CM | POA: Diagnosis not present

## 2017-07-30 DIAGNOSIS — R9431 Abnormal electrocardiogram [ECG] [EKG]: Secondary | ICD-10-CM | POA: Diagnosis not present

## 2018-03-31 ENCOUNTER — Other Ambulatory Visit: Payer: Self-pay

## 2018-03-31 ENCOUNTER — Emergency Department (HOSPITAL_BASED_OUTPATIENT_CLINIC_OR_DEPARTMENT_OTHER)
Admission: EM | Admit: 2018-03-31 | Discharge: 2018-03-31 | Disposition: A | Discharge status: Home / Self Care | Payer: BLUE CROSS/BLUE SHIELD | Attending: Emergency Medicine | Admitting: Emergency Medicine

## 2018-03-31 ENCOUNTER — Emergency Department (HOSPITAL_BASED_OUTPATIENT_CLINIC_OR_DEPARTMENT_OTHER): Payer: BLUE CROSS/BLUE SHIELD

## 2018-03-31 ENCOUNTER — Encounter (HOSPITAL_BASED_OUTPATIENT_CLINIC_OR_DEPARTMENT_OTHER): Payer: Self-pay | Admitting: Emergency Medicine

## 2018-03-31 DIAGNOSIS — M5412 Radiculopathy, cervical region: Secondary | ICD-10-CM | POA: Diagnosis not present

## 2018-03-31 DIAGNOSIS — J45909 Unspecified asthma, uncomplicated: Secondary | ICD-10-CM | POA: Insufficient documentation

## 2018-03-31 DIAGNOSIS — G44209 Tension-type headache, unspecified, not intractable: Secondary | ICD-10-CM

## 2018-03-31 DIAGNOSIS — Z79899 Other long term (current) drug therapy: Secondary | ICD-10-CM | POA: Diagnosis not present

## 2018-03-31 DIAGNOSIS — R200 Anesthesia of skin: Secondary | ICD-10-CM | POA: Diagnosis not present

## 2018-03-31 DIAGNOSIS — R51 Headache: Secondary | ICD-10-CM | POA: Diagnosis not present

## 2018-03-31 HISTORY — DX: Migraine, unspecified, not intractable, without status migrainosus: G43.909

## 2018-03-31 MED ORDER — SODIUM CHLORIDE 0.9 % IV BOLUS
1000.0000 mL | Freq: Once | INTRAVENOUS | Status: AC
Start: 2018-03-31 — End: 2018-03-31
  Administered 2018-03-31: 1000 mL via INTRAVENOUS

## 2018-03-31 MED ORDER — METHOCARBAMOL 500 MG PO TABS: 500.0000 mg | ORAL_TABLET | Freq: Two times a day (BID) | ORAL | 0 refills | Status: DC

## 2018-03-31 MED ORDER — METOCLOPRAMIDE HCL 5 MG/ML IJ SOLN
10.0000 mg | Freq: Once | INTRAMUSCULAR | Status: AC
  Administered 2018-03-31: 10 mg via INTRAVENOUS
  Filled 2018-03-31: qty 2

## 2018-03-31 NOTE — Discharge Instructions (Signed)
Take Robaxin twice daily as needed for muscle pain or spasms.  You can continue alternating Tylenol and ibuprofen as needed for headache or shoulder pain.  Use ice and heat alternating 20 minutes on, 20 minutes off.  Attempt the exercises and stretches we discussed few times daily.  Try to avoid doing any heavy lifting or repetitive motion with your arms for couple days.  Please return the emergency department you develop any new or worsening symptoms.

## 2018-03-31 NOTE — ED Notes (Addendum)
Pt c/o back head pain x 2 days. Pt denies nausea or vomiting, but states he is having photosensitivity. Denies injury. Pt states he has history of migraines, but has not had to get treatment for the last year. Denies sinus congestion or cold symptoms, denies history of hypertension

## 2018-03-31 NOTE — ED Triage Notes (Signed)
H/A x 2 days, hx of same. Last took OTC meds yesterday

## 2018-03-31 NOTE — ED Provider Notes (Signed)
[facility] EMERGENCY DEPARTMENT Provider Note   CSN: [id number] Arrival date & time: 03/31/18  1207     History   Chief Complaint Chief Complaint  Patient presents with  . Headache    HPI Adam Mcneil is a 48 y.o. male with history of migraines, asthma who presents with a 2-day history of headache and neck pain.  He is also had pain rating down his left arm and intermittent tingling.  Patient has had associated photophobia.  He denies any nausea or vomiting.  He reports he usually has pain all over his head with his migraines.  He does not normally have arm pain tingling related to it.  He has been taking ibuprofen and Tylenol without relief.  He denies any vision changes, fevers, chest pain, shortness of breath, difficulty moving his neck, abdominal pain.  Patient works with his hands with repetitive motions at his job.  HPI  Past Medical History:  Diagnosis Date  . Asthma   . Migraine     Patient Active Problem List   Diagnosis Date Noted  . Left knee injury, subsequent encounter 01/23/2017    History reviewed. No pertinent surgical history.      Home Medications    Prior to Admission medications   Medication Sig Start Date End Date Taking? Authorizing Provider  aspirin-sod bicarb-citric acid (ALKA-SELTZER) 325 MG TBEF Take 325 mg by mouth every 6 (six) hours as needed.      [provider]  dicyclomine (BENTYL) 20 MG tablet Take 1 tablet (20 mg total) by mouth 2 (two) times daily. 08/28/16   Alvira MondaySchlossman, Erin, MD  erythromycin ophthalmic ointment Place a 1/2 inch ribbon of ointment into the lower eyelid 4 times daily for 5 days 04/22/16   Hedges, Tinnie GensJeffrey, PA-C  methocarbamol (ROBAXIN) 500 MG tablet Take 1 tablet (500 mg total) by mouth 2 (two) times daily. 03/31/18   Namira Rosekrans, Waylan BogaAlexandra M, PA-C  naproxen (NAPROSYN) 500 MG tablet Take 1 tablet (500 mg total) by mouth 2 (two) times daily. 02/22/17   Hudnall, Azucena FallenShane R, MD  ondansetron (ZOFRAN ODT) 4 MG  disintegrating tablet Take 1 tablet (4 mg total) by mouth every 8 (eight) hours as needed for nausea or vomiting. 08/28/16   Alvira MondaySchlossman, Erin, MD    Family History No family history on file.  Social History Social History   Tobacco Use  . Smoking status: Never Smoker  . Smokeless tobacco: Never Used  Substance Use Topics  . Alcohol use: Yes    Comment: occassional  . Drug use: No     Allergies   Patient has no known allergies.   Review of Systems Review of Systems  Constitutional: Negative for chills and fever.  HENT: Negative for facial swelling and sore throat.   Respiratory: Negative for shortness of breath.   Cardiovascular: Negative for chest pain.  Gastrointestinal: Negative for abdominal pain, nausea and vomiting.  Genitourinary: Negative for dysuria.  Musculoskeletal: Positive for neck pain. Negative for back pain and neck stiffness.  Skin: Negative for rash and wound.  Neurological: Positive for numbness (paresthesia) and headaches.  Psychiatric/Behavioral: The patient is not nervous/anxious.      Physical Exam Updated Vital Signs BP 124/65 (BP Location: Left Arm)   Pulse 62   Temp 98.3 F (36.8 C) (Oral)   Resp 18   Ht 5\' 8"  (1.727 m)   Wt 115.7 kg   SpO2 98%   BMI 38.77 kg/m   Physical Exam Vitals signs and nursing  note reviewed.  Constitutional:      General: He is not in acute distress.    Appearance: He is well-developed. He is not diaphoretic.  HENT:     Head: Normocephalic and atraumatic.     Mouth/Throat:     Pharynx: No oropharyngeal exudate.  Eyes:     General: No scleral icterus.       Right eye: No discharge.        Left eye: No discharge.     Conjunctiva/sclera: Conjunctivae normal.     Pupils: Pupils are equal, round, and reactive to light.  Neck:     Musculoskeletal: Normal range of motion and neck supple. Normal range of motion. Spinous process tenderness and muscular tenderness present. No neck rigidity.     Thyroid: No  thyromegaly.  Cardiovascular:     Rate and Rhythm: Normal rate and regular rhythm.     Heart sounds: Normal heart sounds. No murmur. No friction rub. No gallop.   Pulmonary:     Effort: Pulmonary effort is normal. No respiratory distress.     Breath sounds: Normal breath sounds. No stridor. No wheezing or rales.  Abdominal:     General: Bowel sounds are normal. There is no distension.     Palpations: Abdomen is soft.     Tenderness: There is no abdominal tenderness. There is no guarding or rebound.  Musculoskeletal:     Comments: Tenderness and spasm noted to the left upper trapezius  Lymphadenopathy:     Cervical: No cervical adenopathy.  Skin:    General: Skin is warm and dry.     Coloration: Skin is not pale.     Findings: No rash.  Neurological:     Mental Status: He is alert.     GCS: GCS eye subscore is 4. GCS verbal subscore is 5. GCS motor subscore is 6.     Coordination: Coordination normal.     Comments: CN 3-12 intact; normal sensation throughout, but patient states it feels a little bit different on his left arm; 5/5 strength in all 4 extremities; equal bilateral grip strength      ED Treatments / Results  Labs (all labs ordered are listed, but only abnormal results are displayed) Labs Reviewed - No data to display  EKG None  Radiology Ct Head Wo Contrast  Result Date: 03/31/2018 CLINICAL DATA:  Headache. Left arm tingling and pain. EXAM: CT HEAD WITHOUT CONTRAST TECHNIQUE: Contiguous axial images were obtained from the base of the skull through the vertex without intravenous contrast. COMPARISON:  12/10/2015 FINDINGS: Brain: The brainstem, cerebellum, cerebral peduncles, thalami, basal ganglia, basilar cisterns, and ventricular system appear within normal limits. No intracranial hemorrhage, mass lesion, or acute CVA. Vascular: Unremarkable Skull: Unremarkable Sinuses/Orbits: Mild chronic maxillary and ethmoid sinusitis. Other: No supplemental non-categorized  findings. IMPRESSION: 1. No acute intracranial findings. 2. Mild chronic maxillary and ethmoid sinusitis. Electronically Signed   By: Gaylyn Rong M.D.   On: 03/31/2018 12:57    Procedures Procedures (including critical care time)  Medications Ordered in ED Medications  sodium chloride 0.9 % bolus 1,000 mL (0 mLs Intravenous Stopped 03/31/18 1417)  metoCLOPramide (REGLAN) injection 10 mg (10 mg Intravenous Given 03/31/18 1320)     Initial Impression / Assessment and Plan / ED Course  I have reviewed the triage vital signs and the nursing notes.  Pertinent labs & imaging results that were available during my care of the patient were reviewed by me and considered in my medical decision  making (see chart for details).     Patient presenting with suspected cervical radiculopathy as well as headache, most likely tension type.  CT head completed considering different than normal migraine and neuro deficit in the left arm and is negative for acute findings.  Patient is feeling better after IV fluids and Reglan.  Will discharge home with Robaxin and continue ibuprofen and Tylenol as needed for headache and pain.  Heat and ice discussed for muscle spasm and pain.  Advised rest from his job for couple days to help with symptoms.  Return precautions discussed.  Patient understands and agrees with plan.  Patient vitals stable throughout ED course and discharged in satisfactory condition. I discussed patient case with Dr. Fredderick PhenixBelfi who guided the patient's management and agrees with plan.   Final Clinical Impressions(s) / ED Diagnoses   Final diagnoses:  Acute non intractable tension-type headache  Cervical radiculopathy    ED Discharge Orders         Ordered    methocarbamol (ROBAXIN) 500 MG tablet  2 times daily     03/31/18 [address]1406           Delanna Blacketer BaltaM, [place] JerseyPA-C 03/31/18 1613    Maia PlanLong, Joshua G, MD 04/01/18 1736

## 2018-09-12 ENCOUNTER — Encounter (HOSPITAL_BASED_OUTPATIENT_CLINIC_OR_DEPARTMENT_OTHER): Payer: Self-pay

## 2018-09-12 ENCOUNTER — Observation Stay (HOSPITAL_BASED_OUTPATIENT_CLINIC_OR_DEPARTMENT_OTHER)
Admission: EM | Admit: 2018-09-12 | Discharge: 2018-09-13 | Disposition: A | Discharge status: Home / Self Care | Payer: BC Managed Care – PPO | Attending: Internal Medicine | Admitting: Internal Medicine

## 2018-09-12 ENCOUNTER — Other Ambulatory Visit: Payer: Self-pay

## 2018-09-12 ENCOUNTER — Emergency Department (HOSPITAL_BASED_OUTPATIENT_CLINIC_OR_DEPARTMENT_OTHER): Payer: BC Managed Care – PPO

## 2018-09-12 DIAGNOSIS — N400 Enlarged prostate without lower urinary tract symptoms: Secondary | ICD-10-CM | POA: Insufficient documentation

## 2018-09-12 DIAGNOSIS — Z79899 Other long term (current) drug therapy: Secondary | ICD-10-CM | POA: Diagnosis not present

## 2018-09-12 DIAGNOSIS — Z20828 Contact with and (suspected) exposure to other viral communicable diseases: Secondary | ICD-10-CM | POA: Diagnosis not present

## 2018-09-12 DIAGNOSIS — Z1159 Encounter for screening for other viral diseases: Secondary | ICD-10-CM | POA: Insufficient documentation

## 2018-09-12 DIAGNOSIS — K859 Acute pancreatitis without necrosis or infection, unspecified: Secondary | ICD-10-CM | POA: Diagnosis not present

## 2018-09-12 DIAGNOSIS — R1011 Right upper quadrant pain: Secondary | ICD-10-CM | POA: Diagnosis present

## 2018-09-12 DIAGNOSIS — K4020 Bilateral inguinal hernia, without obstruction or gangrene, not specified as recurrent: Secondary | ICD-10-CM | POA: Insufficient documentation

## 2018-09-12 DIAGNOSIS — J45909 Unspecified asthma, uncomplicated: Secondary | ICD-10-CM | POA: Insufficient documentation

## 2018-09-12 DIAGNOSIS — Z791 Long term (current) use of non-steroidal anti-inflammatories (NSAID): Secondary | ICD-10-CM | POA: Diagnosis not present

## 2018-09-12 DIAGNOSIS — R52 Pain, unspecified: Secondary | ICD-10-CM | POA: Diagnosis not present

## 2018-09-12 DIAGNOSIS — Z7982 Long term (current) use of aspirin: Secondary | ICD-10-CM | POA: Diagnosis not present

## 2018-09-12 DIAGNOSIS — K3189 Other diseases of stomach and duodenum: Secondary | ICD-10-CM | POA: Diagnosis not present

## 2018-09-12 DIAGNOSIS — K760 Fatty (change of) liver, not elsewhere classified: Secondary | ICD-10-CM | POA: Diagnosis not present

## 2018-09-12 DIAGNOSIS — N182 Chronic kidney disease, stage 2 (mild): Secondary | ICD-10-CM | POA: Diagnosis not present

## 2018-09-12 LAB — URINALYSIS, ROUTINE W REFLEX MICROSCOPIC
Bilirubin Urine: NEGATIVE
Glucose, UA: NEGATIVE mg/dL
Hgb urine dipstick: NEGATIVE
Ketones, ur: NEGATIVE mg/dL
Leukocytes,Ua: NEGATIVE
Nitrite: NEGATIVE
Protein, ur: NEGATIVE mg/dL
Specific Gravity, Urine: 1.02 (ref 1.005–1.030)
pH: 7.5 (ref 5.0–8.0)

## 2018-09-12 LAB — CBC WITH DIFFERENTIAL/PLATELET
Abs Immature Granulocytes: 0.03 10*3/uL (ref 0.00–0.07)
Basophils Absolute: 0.1 10*3/uL (ref 0.0–0.1)
Basophils Relative: 1 %
Eosinophils Absolute: 0.1 10*3/uL (ref 0.0–0.5)
Eosinophils Relative: 2 %
HCT: 41.6 % (ref 39.0–52.0)
Hemoglobin: 13.2 g/dL (ref 13.0–17.0)
Immature Granulocytes: 1 %
Lymphocytes Relative: 32 %
Lymphs Abs: 1.4 10*3/uL (ref 0.7–4.0)
MCH: 27 pg (ref 26.0–34.0)
MCHC: 31.7 g/dL (ref 30.0–36.0)
MCV: 85.2 fL (ref 80.0–100.0)
Monocytes Absolute: 0.3 10*3/uL (ref 0.1–1.0)
Monocytes Relative: 7 %
Neutro Abs: 2.5 10*3/uL (ref 1.7–7.7)
Neutrophils Relative %: 57 %
Platelets: 221 10*3/uL (ref 150–400)
RBC: 4.88 MIL/uL (ref 4.22–5.81)
RDW: 18.1 % — ABNORMAL HIGH (ref 11.5–15.5)
WBC: 4.4 10*3/uL (ref 4.0–10.5)
nRBC: 0 % (ref 0.0–0.2)

## 2018-09-12 LAB — COMPREHENSIVE METABOLIC PANEL
ALT: 33 U/L (ref 0–44)
AST: 26 U/L (ref 15–41)
Albumin: 3.8 g/dL (ref 3.5–5.0)
Alkaline Phosphatase: 63 U/L (ref 38–126)
Anion gap: 6 (ref 5–15)
BUN: 12 mg/dL (ref 6–20)
CO2: 25 mmol/L (ref 22–32)
Calcium: 10.2 mg/dL (ref 8.9–10.3)
Chloride: 106 mmol/L (ref 98–111)
Creatinine, Ser: 1.32 mg/dL — ABNORMAL HIGH (ref 0.61–1.24)
GFR calc Af Amer: >60 mL/min (ref >60)
GFR calc non Af Amer: >60 mL/min (ref >60)
Glucose, Bld: 131 mg/dL — ABNORMAL HIGH (ref 70–99)
Potassium: 4 mmol/L (ref 3.5–5.1)
Sodium: 137 mmol/L (ref 135–145)
Total Bilirubin: 0.4 mg/dL (ref 0.3–1.2)
Total Protein: 6.7 g/dL (ref 6.5–8.1)

## 2018-09-12 LAB — SARS CORONAVIRUS 2 AG (30 MIN TAT): SARS Coronavirus 2 Ag: NEGATIVE

## 2018-09-12 LAB — LIPASE, BLOOD: Lipase: 214 U/L — ABNORMAL HIGH (ref 11–51)

## 2018-09-12 MED ORDER — ONDANSETRON HCL 4 MG/2ML IJ SOLN
4.0000 mg | Freq: Once | INTRAMUSCULAR | Status: AC
  Administered 2018-09-12: 4 mg via INTRAVENOUS
  Filled 2018-09-12: qty 2

## 2018-09-12 MED ORDER — FENTANYL CITRATE (PF) 100 MCG/2ML IJ SOLN
50.0000 ug | Freq: Once | INTRAMUSCULAR | Status: AC
  Administered 2018-09-12: 50 ug via INTRAVENOUS
  Filled 2018-09-12: qty 2

## 2018-09-12 MED ORDER — SODIUM CHLORIDE 0.9 % IV BOLUS
1000.0000 mL | Freq: Once | INTRAVENOUS | Status: AC
  Administered 2018-09-12: 1000 mL via INTRAVENOUS

## 2018-09-12 MED ORDER — SODIUM CHLORIDE 0.9 % IV SOLN
Freq: Once | INTRAVENOUS | Status: AC
  Administered 2018-09-12: 20:00:00 via INTRAVENOUS

## 2018-09-12 NOTE — ED Triage Notes (Addendum)
Pt c/o RUQ/rib pain and SOB x 6 days-states "feels like gallbladder"-denies hx of GB disease-grimacing-steady gait

## 2018-09-12 NOTE — ED Notes (Signed)
[facility] notified (Kim) - patient ready for transport 

## 2018-09-12 NOTE — ED Notes (Signed)
ED Provider at bedside. 

## 2018-09-12 NOTE — ED Provider Notes (Signed)
[facility] EMERGENCY DEPARTMENT Provider Note   CSN: [id number] Arrival date & time: 09/12/18  1531    History   Chief Complaint Chief Complaint  Patient presents with  . Abdominal Pain    HPI Adam Mcneil is a 48 y.o. male.     The history is provided by the patient.  Abdominal Pain Pain location:  Epigastric and RUQ Pain quality: aching and dull   Pain radiates to:  Does not radiate Pain severity:  Mild Onset quality:  Gradual Timing:  Intermittent Progression:  Waxing and waning Chronicity:  New Context: not alcohol use   Relieved by:  Nothing Worsened by:  Eating Associated symptoms: nausea   Associated symptoms: no chest pain, no chills, no cough, no dysuria, no fever, no hematuria, no shortness of breath, no sore throat and no vomiting   Risk factors: no alcohol abuse, has not had multiple surgeries and no NSAID use     Past Medical History:  Diagnosis Date  . Asthma   . Migraine     Patient Active Problem List   Diagnosis Date Noted  . Acute abdominal pain in right upper quadrant 09/12/2018  . Left knee injury, subsequent encounter 01/23/2017    History reviewed. No pertinent surgical history.      Home Medications    Prior to Admission medications   Medication Sig Start Date End Date Taking? Authorizing Provider  aspirin-sod bicarb-citric acid (ALKA-SELTZER) 325 MG TBEF Take 325 mg by mouth every 6 (six) hours as needed.      [provider]  dicyclomine (BENTYL) 20 MG tablet Take 1 tablet (20 mg total) by mouth 2 (two) times daily. 08/28/16   Alvira MondaySchlossman, Erin, MD  erythromycin ophthalmic ointment Place a 1/2 inch ribbon of ointment into the lower eyelid 4 times daily for 5 days 04/22/16   Hedges, Tinnie GensJeffrey, PA-C  methocarbamol (ROBAXIN) 500 MG tablet Take 1 tablet (500 mg total) by mouth 2 (two) times daily. 03/31/18   Law, Waylan BogaAlexandra M, PA-C  naproxen (NAPROSYN) 500 MG tablet Take 1 tablet (500 mg total) by mouth 2 (two) times  daily. 02/22/17   Hudnall, Azucena FallenShane R, MD  ondansetron (ZOFRAN ODT) 4 MG disintegrating tablet Take 1 tablet (4 mg total) by mouth every 8 (eight) hours as needed for nausea or vomiting. 08/28/16   Alvira MondaySchlossman, Erin, MD    Family History No family history on file.  Social History Social History   Tobacco Use  . Smoking status: Never Smoker  . Smokeless tobacco: Never Used  Substance Use Topics  . Alcohol use: Yes    Comment: occassional  . Drug use: No     Allergies   Patient has no known allergies.   Review of Systems Review of Systems  Constitutional: Negative for chills and fever.  HENT: Negative for ear pain and sore throat.   Eyes: Negative for pain and visual disturbance.  Respiratory: Negative for cough and shortness of breath.   Cardiovascular: Negative for chest pain and palpitations.  Gastrointestinal: Positive for abdominal pain and nausea. Negative for vomiting.  Genitourinary: Negative for dysuria and hematuria.  Musculoskeletal: Negative for arthralgias and back pain.  Skin: Negative for color change and rash.  Neurological: Negative for seizures and syncope.  All other systems reviewed and are negative.    Physical Exam Updated Vital Signs  ED Triage Vitals  Enc Vitals Group     BP 09/12/18 1539 (!) 170/98     Pulse Rate 09/12/18 1539 77  Resp 09/12/18 1539 16     Temp 09/12/18 1539 97.8 F (36.6 C)     Temp Source 09/12/18 1539 Oral     SpO2 09/12/18 1539 100 %     Weight 09/12/18 1540 260 lb (117.9 kg)     Height 09/12/18 1540 5\' 8"  (1.727 m)     Head Circumference --      Peak Flow --      Pain Score 09/12/18 1545 10     Pain Loc --      Pain Edu? --      Excl. in GC? --     Physical Exam Vitals signs and nursing note reviewed.  Constitutional:      General: He is in acute distress.     Appearance: He is well-developed. He is not ill-appearing.  HENT:     Head: Normocephalic and atraumatic.  Eyes:     Extraocular Movements:  Extraocular movements intact.     Conjunctiva/sclera: Conjunctivae normal.     Pupils: Pupils are equal, round, and reactive to light.  Neck:     Musculoskeletal: Neck supple.  Cardiovascular:     Rate and Rhythm: Normal rate and regular rhythm.     Heart sounds: Normal heart sounds. No murmur.  Pulmonary:     Effort: Pulmonary effort is normal. No respiratory distress.     Breath sounds: Normal breath sounds.  Abdominal:     Palpations: Abdomen is soft.     Tenderness: There is abdominal tenderness in the right upper quadrant and epigastric area. There is guarding. There is no right CVA tenderness, left CVA tenderness or rebound. Negative signs include Murphy's sign and Rovsing's sign.  Skin:    General: Skin is warm and dry.     Capillary Refill: Capillary refill takes less than 2 seconds.  Neurological:     General: No focal deficit present.     Mental Status: He is alert.  Psychiatric:        Mood and Affect: Mood normal.      ED Treatments / Results  Labs (all labs ordered are listed, but only abnormal results are displayed) Labs Reviewed  CBC WITH DIFFERENTIAL/PLATELET - Abnormal; Notable for the following components:      Result Value   RDW 18.1 (*)    All other components within normal limits  COMPREHENSIVE METABOLIC PANEL - Abnormal; Notable for the following components:   Glucose, Bld 131 (*)    Creatinine, Ser 1.32 (*)    All other components within normal limits  LIPASE, BLOOD - Abnormal; Notable for the following components:   Lipase 214 (*)    All other components within normal limits  SARS CORONAVIRUS 2 (HOSP ORDER, PERFORMED IN [place] [facility] VIA ABBOTT ID)  URINALYSIS, ROUTINE W REFLEX MICROSCOPIC    EKG None  Radiology Ct Renal Stone Study  Result Date: 09/12/2018 CLINICAL DATA:  Right upper quadrant pain x6 days. EXAM: CT ABDOMEN AND PELVIS WITHOUT CONTRAST TECHNIQUE: Multidetector CT imaging of the abdomen and pelvis was performed following the  standard protocol without IV contrast. COMPARISON:  None. FINDINGS: Lower chest: No acute abnormality. Hepatobiliary: There is hepatic steatosis. The gallbladder is unremarkable. Pancreas: Unremarkable. No pancreatic ductal dilatation or surrounding inflammatory changes. Spleen: Normal in size without focal abnormality. Adrenals/Urinary Tract: Adrenal glands are unremarkable. Kidneys are normal, without renal calculi, focal lesion, or hydronephrosis. Bladder is unremarkable. Stomach/Bowel: Stomach is within normal limits. There is a masslike appearance of the gastric antrum which  is favored to be secondary to enteric material as opposed to a gastric mass. Appendix appears normal. No evidence of bowel wall thickening, distention, or inflammatory changes. Vascular/Lymphatic: No significant vascular findings are present. No enlarged abdominal or pelvic lymph nodes. Reproductive: The prostate gland is mildly enlarged. Other: There are bilateral fat containing inguinal hernias. There is a small fat containing periumbilical hernia. Musculoskeletal: No acute or significant osseous findings. IMPRESSION: 1. No acute intra-abdominal abnormality. No specific abnormality identified to explain the patient's symptoms. The gallbladder is unremarkable by CT standards. 2. Distention of the gastric antrum which is favored to be a somewhat atypical appearance of food material within the stomach. If there is clinical concern for gastric pathology short interval follow-up or EGD is recommended. 3. Hepatic steatosis. Electronically Signed   By: Katherine Mantlehristopher  Green M.D.   On: 09/12/2018 19:03   [place] Abdomen Limited Ruq  Addendum Date: 09/12/2018   ADDENDUM REPORT: 09/12/2018 21:18 ADDENDUM: This addendum is to create the proper report for this exam. CLINICAL DATA: Right upper quadrant pain for 6 days. EXAM: US ABDOMEN LIMITED - RIGHT UPPER QUADRANT COMPARISON: CT earlier this day.  Right upper quadrant ultrasound 05/31/2016 FINDINGS:  Gallbladder: Only minimally distended. Mild wall thickening of 4 mm. No visualized gallstone. No pericholecystic fluid. No sonographic Murphy sign noted by sonographer. Common bile duct: Diameter: 3 mm, normal. Liver: Increased echogenicity of liver compatible with steatosis. Focal fatty sparing adjacent the gallbladder fossa. Cyst on prior exam not well visualized currently. There is normal directional flow in the main portal vein. IMPRESSION: 1. Contracted gallbladder, nonspecific but can be seen with chronic cholecystitis. Mild wall thickening of 4 mm may be normal for degree of distension. Consider nuclear medicine hepatic biliary scan if there is clinical concern for acute cholecystitis. 2. Hepatic steatosis. Electronically Signed   By: Narda RutherfordMelanie  Sanford M.D.   On: 09/12/2018 21:18   Result Date: 09/12/2018 : Please pick the correct US ABDOMEN LIMITED template. Electronically Signed: By: Marlan Palauharles  Clark M.D. On: 09/12/2018 20:45    Procedures Procedures (including critical care time)  Medications Ordered in ED Medications  fentaNYL (SUBLIMAZE) injection 50 mcg (has no administration in time range)  sodium chloride 0.9 % bolus 1,000 mL (0 mLs Intravenous Stopped 09/12/18 1857)  ondansetron (ZOFRAN) injection 4 mg (4 mg Intravenous Given 09/12/18 1653)  fentaNYL (SUBLIMAZE) injection 50 mcg (50 mcg Intravenous Given 09/12/18 1653)  fentaNYL (SUBLIMAZE) injection 50 mcg (50 mcg Intravenous Given 09/12/18 1820)  0.9 %  sodium chloride infusion ( Intravenous New Bag/Given 09/12/18 1933)     Initial Impression / Assessment and Plan / ED Course  I have reviewed the triage vital signs and the nursing notes.  Pertinent labs & imaging results that were available during my care of the patient were reviewed by me and considered in my medical decision making (see chart for details).     Adam Mcneil is a 48 year old male with no significant medical history presents the ED with abdominal pain.  Patient  with right upper/epigastric abdominal pain for the last several hours.  Has had intermittent pain like this in the past.  Patient has not been able to eat or drink.  Pain started after eating food.  Denies any alcohol use.  No history of intra-abdominal surgeries.  Patient with tenderness in the epigastric and right upper quadrant on exam.  Has had some nausea.  Lab work ordered.  Will order CT scan.  No history of kidney stones.  No history  of gallstones.  No urinary symptoms.  Urinalysis unremarkable.  Gallbladder and liver enzymes within normal limits.  Lipase is elevated 214.  CT scan showed no acute pancreatitis or gallbladder etiology.  There was some distention of the gastric antrum which is favored to be food however they recommend EGD follow-up.  Patient overall continues to have pain we will get an ultrasound to further evaluate the gallbladder for possible gallstones, sludge.  Patient denies any alcohol use.  No history of pancreatitis.  Suspect the pain is likely from pancreas although CT scan did not show any signs of major inflammation of the pancreas.  Will give further dose of IV fentanyl.  Will give IV hydration.  We will follow-up ultrasound to make sure there is no surgical process.  May need admission for pain control, hydration for pancreatitis although patient does not seem to want admission. Will re-eval after ultrasound.  Patient with overall unremarkable ultrasound.  Likely chronic cholecystitis.  Patient continues to have pain and will admit for further pain control.  Suspect patient with pancreatitis, may benefit from a HIDA scan.  Patient to be admitted to [facility] long for further care.  Hemodynamically stable, care.  This chart was dictated using voice recognition software.  Despite best efforts to proofread,  errors can occur which can change the documentation meaning.    Final Clinical Impressions(s) / ED Diagnoses   Final diagnoses:  Pain  Acute pancreatitis, unspecified  complication status, unspecified pancreatitis type    ED Discharge Orders    None       Lennice Sites, DO 09/12/18 2303

## 2018-09-12 NOTE — ED Notes (Signed)
Attempted to call report. Callback number left with Bryson Ha

## 2018-09-12 NOTE — ED Notes (Signed)
US at bedside

## 2018-09-12 NOTE — ED Notes (Signed)
EDP notified that patient requested pain medication

## 2018-09-13 ENCOUNTER — Encounter (HOSPITAL_COMMUNITY): Payer: Self-pay | Admitting: Family Medicine

## 2018-09-13 ENCOUNTER — Observation Stay (HOSPITAL_COMMUNITY): Payer: BC Managed Care – PPO

## 2018-09-13 DIAGNOSIS — R748 Abnormal levels of other serum enzymes: Secondary | ICD-10-CM

## 2018-09-13 DIAGNOSIS — R1011 Right upper quadrant pain: Secondary | ICD-10-CM

## 2018-09-13 DIAGNOSIS — N182 Chronic kidney disease, stage 2 (mild): Secondary | ICD-10-CM | POA: Diagnosis present

## 2018-09-13 DIAGNOSIS — K859 Acute pancreatitis without necrosis or infection, unspecified: Secondary | ICD-10-CM

## 2018-09-13 LAB — CBC WITH DIFFERENTIAL/PLATELET
Abs Immature Granulocytes: 0.03 10*3/uL (ref 0.00–0.07)
Basophils Absolute: 0.1 10*3/uL (ref 0.0–0.1)
Basophils Relative: 1 %
Eosinophils Absolute: 0.1 10*3/uL (ref 0.0–0.5)
Eosinophils Relative: 1 %
HCT: 45.2 % (ref 39.0–52.0)
Hemoglobin: 14.1 g/dL (ref 13.0–17.0)
Immature Granulocytes: 1 %
Lymphocytes Relative: 32 %
Lymphs Abs: 1.9 10*3/uL (ref 0.7–4.0)
MCH: 27.4 pg (ref 26.0–34.0)
MCHC: 31.2 g/dL (ref 30.0–36.0)
MCV: 87.8 fL (ref 80.0–100.0)
Monocytes Absolute: 0.4 10*3/uL (ref 0.1–1.0)
Monocytes Relative: 6 %
Neutro Abs: 3.5 10*3/uL (ref 1.7–7.7)
Neutrophils Relative %: 59 %
Platelets: 246 10*3/uL (ref 150–400)
RBC: 5.15 MIL/uL (ref 4.22–5.81)
RDW: 18.7 % — ABNORMAL HIGH (ref 11.5–15.5)
WBC: 5.8 10*3/uL (ref 4.0–10.5)
nRBC: 0 % (ref 0.0–0.2)

## 2018-09-13 LAB — COMPREHENSIVE METABOLIC PANEL
ALT: 41 U/L (ref 0–44)
AST: 30 U/L (ref 15–41)
Albumin: 4.2 g/dL (ref 3.5–5.0)
Alkaline Phosphatase: 69 U/L (ref 38–126)
Anion gap: 6 (ref 5–15)
BUN: 10 mg/dL (ref 6–20)
CO2: 24 mmol/L (ref 22–32)
Calcium: 10.3 mg/dL (ref 8.9–10.3)
Chloride: 108 mmol/L (ref 98–111)
Creatinine, Ser: 1.23 mg/dL (ref 0.61–1.24)
GFR calc Af Amer: >60 mL/min (ref >60)
GFR calc non Af Amer: >60 mL/min (ref >60)
Glucose, Bld: 109 mg/dL — ABNORMAL HIGH (ref 70–99)
Potassium: 4.3 mmol/L (ref 3.5–5.1)
Sodium: 138 mmol/L (ref 135–145)
Total Bilirubin: 0.5 mg/dL (ref 0.3–1.2)
Total Protein: 7.4 g/dL (ref 6.5–8.1)

## 2018-09-13 LAB — LIPASE, BLOOD: Lipase: 38 U/L (ref 11–51)

## 2018-09-13 LAB — TRIGLYCERIDES: Triglycerides: 84 mg/dL (ref <150)

## 2018-09-13 MED ORDER — ACETAMINOPHEN 325 MG PO TABS: 650.0000 mg | ORAL_TABLET | Freq: Four times a day (QID) | ORAL | Status: DC | PRN

## 2018-09-13 MED ORDER — SODIUM CHLORIDE 0.9 % IV SOLN
INTRAVENOUS | Status: AC
  Administered 2018-09-13 (×2): via INTRAVENOUS

## 2018-09-13 MED ORDER — ONDANSETRON HCL 4 MG PO TABS: 4.0000 mg | ORAL_TABLET | Freq: Four times a day (QID) | ORAL | Status: DC | PRN

## 2018-09-13 MED ORDER — ONDANSETRON HCL 4 MG/2ML IJ SOLN: 4.0000 mg | Freq: Four times a day (QID) | INTRAMUSCULAR | Status: DC | PRN

## 2018-09-13 MED ORDER — ACETAMINOPHEN 650 MG RE SUPP: 650.0000 mg | Freq: Four times a day (QID) | RECTAL | Status: DC | PRN

## 2018-09-13 MED ORDER — ENOXAPARIN SODIUM 60 MG/0.6ML ~~LOC~~ SOLN
60.0000 mg | SUBCUTANEOUS | Status: DC
  Administered 2018-09-13: 60 mg via SUBCUTANEOUS
  Filled 2018-09-13: qty 0.6

## 2018-09-13 MED ORDER — TECHNETIUM TC 99M MEBROFENIN IV KIT
5.3000 | PACK | Freq: Once | INTRAVENOUS | Status: AC | PRN
  Administered 2018-09-13: 11:00:00 5.3 via INTRAVENOUS

## 2018-09-13 MED ORDER — FENTANYL CITRATE (PF) 100 MCG/2ML IJ SOLN
25.0000 ug | INTRAMUSCULAR | Status: DC | PRN
  Administered 2018-09-13: 25 ug via INTRAVENOUS
  Filled 2018-09-13: qty 2

## 2018-09-13 NOTE — Progress Notes (Signed)
Pharmacy Dose adjustment LMWH 40 mg>> LMWH 60 mg qday for VTE px w/ BMI 40  Eudelia Bunch, Pharm.D 09/13/2018 3:50 AM

## 2018-09-13 NOTE — ED Notes (Signed)
Report given to Tiffany @ [facility] 

## 2018-09-13 NOTE — H&P (Signed)
History and Physical    Adam DanasRaymond S Greenbaum ZOX:[id number] DOB: Aug 11, 1970 DOA: 09/12/2018  PCP: Patient, No Pcp Per   Patient coming from: Home   Chief Complaint: RUQ pain, nausea   HPI: Adam Mcneil is a 48 y.o. male with medical history significant for mild renal insufficiency, now presenting to the emergency department for evaluation of right upper quadrant abdominal pain.  Patient reports that he developed discomfort in the right upper quadrant of his abdomen several days ago that came and went a few times, but has become more constant and severe over the past day.  He has had nausea associated with this, one episode of vomiting, no diarrhea.  He denies fevers or chills.  Pain is much worse after eating or drinking.  He had a similar episode 2 or 3 years ago that lasted a couple days and then resolve spontaneously.  He reports only rare alcohol use and denies any recent drinking.  States that he does not take any medications regularly.  Denies any cough, shortness of breath, fevers, or sick contacts. No recent fall or trauma.   ED Course: Upon arrival to the ED, patient is found to be afebrile, saturating well on room air, and with initial blood pressure in the 90s systolic.  Chemistry panel is notable for creatinine 1.32 and lipase 214.  Urinalysis and CBC are both unremarkable.  CT abdomen is negative for acute hepatobiliary findings but notable for nonspecific distention of the gastric antrum thought to reflect food in the stomach.  Abdominal ultrasound reveals contracted gallbladder, nonspecific finding, as well as some mild wall thickening.  Patient was given IV fluids, multiple doses IV fentanyl, and Zofran in the ED. COVID-19 test by [facility] was negative.  Hospitalists are asked to admit.  Review of Systems:  All other systems reviewed and apart from HPI, are negative.  Past Medical History:  Diagnosis Date  . Asthma   . Migraine     History reviewed. No pertinent surgical history.    reports that he has never smoked. He has never used smokeless tobacco. He reports current alcohol use. He reports that he does not use drugs.  No Known Allergies  History reviewed. No pertinent family history.   Prior to Admission medications   Medication Sig Start Date End Date Taking? Authorizing Provider  aspirin-sod bicarb-citric acid (ALKA-SELTZER) 325 MG TBEF Take 325 mg by mouth every 6 (six) hours as needed.      [provider]  dicyclomine (BENTYL) 20 MG tablet Take 1 tablet (20 mg total) by mouth 2 (two) times daily. 08/28/16   Alvira MondaySchlossman, Erin, MD  erythromycin ophthalmic ointment Place a 1/2 inch ribbon of ointment into the lower eyelid 4 times daily for 5 days 04/22/16   Hedges, Tinnie GensJeffrey, PA-C  methocarbamol (ROBAXIN) 500 MG tablet Take 1 tablet (500 mg total) by mouth 2 (two) times daily. 03/31/18   Law, Waylan BogaAlexandra M, PA-C  naproxen (NAPROSYN) 500 MG tablet Take 1 tablet (500 mg total) by mouth 2 (two) times daily. 02/22/17   Hudnall, Azucena FallenShane R, MD  ondansetron (ZOFRAN ODT) 4 MG disintegrating tablet Take 1 tablet (4 mg total) by mouth every 8 (eight) hours as needed for nausea or vomiting. 08/28/16   Alvira MondaySchlossman, Erin, MD    Physical Exam: Vitals:   09/13/18 0059 09/13/18 0130 09/13/18 0230 09/13/18 0336  BP:  110/70    Pulse: 72 (!) 53 66   Resp:      Temp:  TempSrc:      SpO2: 98% 95% 96%   Weight:    119.4 kg  Height:    5\' 8"  (1.727 m)    Constitutional: NAD, appears uncomfortable  Eyes: PERTLA, lids and conjunctivae normal ENMT: Mucous membranes are moist. Posterior pharynx clear of any exudate or lesions.   Neck: normal, supple, no masses, no thyromegaly Respiratory: clear to auscultation bilaterally, no wheezing, no crackles. No accessory muscle use.  Cardiovascular: S1 & S2 heard, regular rate and rhythm. No extremity edema.   Abdomen: No distension, soft, tender in RUQ and and right lower lateral chest wall. Bowel sounds active.  Musculoskeletal: no  clubbing / cyanosis. No joint deformity upper and lower extremities.    Skin: no significant rashes, lesions, ulcers. Warm, dry, well-perfused. Neurologic: CN 2-12 grossly intact. Sensation intact. Strength 5/5 in all 4 limbs.  Psychiatric: Alert and oriented x 3. Pleasant, cooperative.    Labs on Admission: I have personally reviewed following labs and imaging studies  CBC: Recent Labs  Lab 09/12/18 1649  WBC 4.4  NEUTROABS 2.5  HGB 13.2  HCT 41.6  MCV 85.2  PLT 221   Basic Metabolic Panel: Recent Labs  Lab 09/12/18 1649  NA 137  K 4.0  CL 106  CO2 25  GLUCOSE 131*  BUN 12  CREATININE 1.32*  CALCIUM 10.2   GFR: Estimated Creatinine Clearance: 84.1 mL/min (A) (by C-G formula based on SCr of 1.32 mg/dL (H)). Liver Function Tests: Recent Labs  Lab 09/12/18 1649  AST 26  ALT 33  ALKPHOS 63  BILITOT 0.4  PROT 6.7  ALBUMIN 3.8   Recent Labs  Lab 09/12/18 1649  LIPASE 214*   No results for input(s): AMMONIA in the last 168 hours. Coagulation Profile: No results for input(s): INR, PROTIME in the last 168 hours. Cardiac Enzymes: No results for input(s): CKTOTAL, CKMB, CKMBINDEX, TROPONINI in the last 168 hours. BNP (last 3 results) No results for input(s): PROBNP in the last 8760 hours. HbA1C: No results for input(s): HGBA1C in the last 72 hours. CBG: No results for input(s): GLUCAP in the last 168 hours. Lipid Profile: No results for input(s): CHOL, HDL, LDLCALC, TRIG, CHOLHDL, LDLDIRECT in the last 72 hours. Thyroid Function Tests: No results for input(s): TSH, T4TOTAL, FREET4, T3FREE, THYROIDAB in the last 72 hours. Anemia Panel: No results for input(s): VITAMINB12, FOLATE, FERRITIN, TIBC, IRON, RETICCTPCT in the last 72 hours. Urine analysis:    Component Value Date/Time   COLORURINE YELLOW 09/12/2018 1556   APPEARANCEUR CLEAR 09/12/2018 1556   LABSPEC 1.020 09/12/2018 1556   PHURINE 7.5 09/12/2018 1556   GLUCOSEU NEGATIVE 09/12/2018 1556   HGBUR  NEGATIVE 09/12/2018 1556   BILIRUBINUR NEGATIVE 09/12/2018 1556   KETONESUR NEGATIVE 09/12/2018 1556   PROTEINUR NEGATIVE 09/12/2018 1556   NITRITE NEGATIVE 09/12/2018 1556   LEUKOCYTESUR NEGATIVE 09/12/2018 1556   Sepsis Labs: @LABRCNTIP (procalcitonin:4,lacticidven:4) ) Recent Results (from the past 240 hour(s))  SARS Coronavirus 2 (Hosp order,Performed [facility] lab via [facility] ID)     Status: None   Collection Time: 09/12/18 10:07 PM   Specimen: Dry Nasal Swab ([facility] ID Now)  Result Value Ref Range Status   SARS Coronavirus 2 ([facility] ID Now) NEGATIVE NEGATIVE Final    Comment: (NOTE) SARS-CoV-2 target nucleic acids are NOT DETECTED. The SARS-CoV-2 RNA is generally detectable in upper and lower respiratory specimens during the acute phase of infection.  Negativeresults do not preclude SARS-CoV-2 infection, do not rule out coinfections with other  pathogens, and should not be used as the  sole basis for treatment or other patient management decisions.  Negative results must be combined with clinical observations, patient history, and epidemiological information. The expected result is Negative. Fact Sheet for Patients: GolfingFamily.no Fact Sheet for Healthcare Providers: https://www.hernandez-brewer.com/ This test is not yet approved or cleared by the [place] FDA and  has been authorized for detection and/or diagnosis of SARS-CoV-2 by FDA under an Emergency Use Authorization (EUA).  This EUA will remain in effect (meaning this test can be used) for the duration of  the COVID19 declaration under Section 5 64(b)(1) of the Act, 21 U.S.C.  section 6705269121 3(b)(1), unless the authorization is terminated or revoked sooner. Performed at [facility], [address] [place], [place] [address]      Radiological Exams on Admission: Ct Renal Stone Study  Result Date: 09/12/2018 CLINICAL DATA:  Right upper quadrant pain x6  days. EXAM: CT ABDOMEN AND PELVIS WITHOUT CONTRAST TECHNIQUE: Multidetector CT imaging of the abdomen and pelvis was performed following the standard protocol without IV contrast. COMPARISON:  None. FINDINGS: Lower chest: No acute abnormality. Hepatobiliary: There is hepatic steatosis. The gallbladder is unremarkable. Pancreas: Unremarkable. No pancreatic ductal dilatation or surrounding inflammatory changes. Spleen: Normal in size without focal abnormality. Adrenals/Urinary Tract: Adrenal glands are unremarkable. Kidneys are normal, without renal calculi, focal lesion, or hydronephrosis. Bladder is unremarkable. Stomach/Bowel: Stomach is within normal limits. There is a masslike appearance of the gastric antrum which is favored to be secondary to enteric material as opposed to a gastric mass. Appendix appears normal. No evidence of bowel wall thickening, distention, or inflammatory changes. Vascular/Lymphatic: No significant vascular findings are present. No enlarged abdominal or pelvic lymph nodes. Reproductive: The prostate gland is mildly enlarged. Other: There are bilateral fat containing inguinal hernias. There is a small fat containing periumbilical hernia. Musculoskeletal: No acute or significant osseous findings. IMPRESSION: 1. No acute intra-abdominal abnormality. No specific abnormality identified to explain the patient's symptoms. The gallbladder is unremarkable by CT standards. 2. Distention of the gastric antrum which is favored to be a somewhat atypical appearance of food material within the stomach. If there is clinical concern for gastric pathology short interval follow-up or EGD is recommended. 3. Hepatic steatosis. Electronically Signed   By: Constance Holster M.D.   On: 09/12/2018 19:03   US Abdomen Limited Ruq  Addendum Date: 09/12/2018   ADDENDUM REPORT: 09/12/2018 21:18 ADDENDUM: This addendum is to create the proper report for this exam. CLINICAL DATA: Right upper quadrant pain for 6  days. EXAM: US ABDOMEN LIMITED - RIGHT UPPER QUADRANT COMPARISON: CT earlier this day.  Right upper quadrant ultrasound 05/31/2016 FINDINGS: Gallbladder: Only minimally distended. Mild wall thickening of 4 mm. No visualized gallstone. No pericholecystic fluid. No sonographic Murphy sign noted by sonographer. Common bile duct: Diameter: 3 mm, normal. Liver: Increased echogenicity of liver compatible with steatosis. Focal fatty sparing adjacent the gallbladder fossa. Cyst on prior exam not well visualized currently. There is normal directional flow in the main portal vein. IMPRESSION: 1. Contracted gallbladder, nonspecific but can be seen with chronic cholecystitis. Mild wall thickening of 4 mm may be normal for degree of distension. Consider nuclear medicine hepatic biliary scan if there is clinical concern for acute cholecystitis. 2. Hepatic steatosis. Electronically Signed   By: Keith Rake M.D.   On: 09/12/2018 21:18   Result Date: 09/12/2018 : Please pick the correct US ABDOMEN LIMITED template. Electronically Signed: By: Juanda Crumble  Chestine Sporelark M.D. On: 09/12/2018 20:45    EKG: Not performed.   Assessment/Plan   1. RUQ pain  - Presents with several days of worsening RUQ pain with nausea, worse after eating or drinking  - Labs notable for lipase 214; CT with no acute hepatobiliary findings but distension of gastric antrum though to reflect food; US with contracted gallbladder with mild wall-thickening  - He was treated in ED with IVF, fentanyl, and Zofran  - Lipase is >4x the upper limit of normal and pancreatitis remains on Ddx but there is no pain in epigastrium or CT findings to support   - Continue bowel-rest, pain-control, and IVF hydration; check triglycerides, HIDA scan    2. CKD stage II  - SCr is 1.32 on admission, consistent with his apparent baseline  - Renally-dose medications     PPE: Mask, face shield. Patient wearing mask.  DVT prophylaxis: Lovenox  Code Status: Full  Family  Communication: Discussed with patient  Consults called: none Admission status: Observation     Briscoe Deutscherimothy S Ahkeem Goede, MD [facility] Pager [phone]  If 7PM-7AM, please contact night-coverage [email or web address] Password [email or web address]  09/13/2018, 3:49 AM  \

## 2018-09-13 NOTE — Plan of Care (Signed)

## 2018-09-13 NOTE — ED Notes (Signed)
RN went to check on patient and he mentioned that the IV was uncomfortable and requested for a site change. RN obliged patient and moved IV out of a place of flexion.

## 2018-09-13 NOTE — Discharge Summary (Signed)
Physician Discharge Summary  Patient ID: Adam Mcneil MRN: [id number] DOB/AGE: Jul 05, 1970 48 y.o.  Admit date: 09/12/2018 Discharge date: 09/13/2018  Admission Diagnoses:  Discharge Diagnoses:  Active Problems:   Acute abdominal pain in right upper quadrant   CKD (chronic kidney disease), stage II   Acute pancreatitis   Discharged Condition: stable  Hospital Course:  Adam Mcneil is a 48 year old male with past medical history significant for mild renal insufficiency and morbid obesity.  Patient presented to the emergency department for evaluation of right upper quadrant abdominal pain.  Reported associated nausea and one episode of vomiting.  Patient denied fever or chills.  Pain was reported to be largely postprandial.  Work-up done in the ER revealed elevated lipase (214).  Serum creatinine was 1.32.  CT renal protocol was nonrevealing.  HIDA scan was negative for acute cholecystitis.  Patient's pain has resolved.  Patient be discharged to the care of the primary care provider.  Consults: None  Significant Diagnostic Studies: Repeat lipase lipase level was 38.    Discharge Exam: Blood pressure 123/65, pulse (!) 58, temperature (!) 97.5 F (36.4 C), temperature source Oral, resp. rate 18, height 5\' 8"  (1.727 m), weight 119.4 kg, SpO2 100 %.  Disposition: Discharge disposition: 01-Home or Self Care   Discharge Instructions    Diet - low sodium heart healthy   Complete by: As directed    Increase activity slowly   Complete by: As directed      Allergies as of 09/13/2018   No Known Allergies     Medication List    STOP taking these medications   erythromycin ophthalmic ointment   ibuprofen 200 MG tablet Commonly known as: ADVIL   methocarbamol 500 MG tablet Commonly known as: ROBAXIN        SignedBonnell Public 09/13/2018, 2:15 PM

## 2018-09-13 NOTE — Progress Notes (Signed)
MD made aware HIDA scan results are available for review. SRP, RN

## 2018-09-14 LAB — NOVEL CORONAVIRUS, NAA (HOSP ORDER, SEND-OUT TO REF LAB; TAT 18-24 HRS): SARS-CoV-2, NAA: NOT DETECTED

## 2018-09-14 LAB — HIV ANTIBODY (ROUTINE TESTING W REFLEX): HIV Screen 4th Generation wRfx: NONREACTIVE

## 2018-09-15 DIAGNOSIS — M549 Dorsalgia, unspecified: Secondary | ICD-10-CM | POA: Diagnosis not present

## 2018-09-15 DIAGNOSIS — R109 Unspecified abdominal pain: Secondary | ICD-10-CM | POA: Diagnosis not present

## 2018-09-16 DIAGNOSIS — Z1159 Encounter for screening for other viral diseases: Secondary | ICD-10-CM | POA: Diagnosis not present

## 2018-09-16 DIAGNOSIS — R1084 Generalized abdominal pain: Secondary | ICD-10-CM | POA: Diagnosis not present

## 2018-09-16 DIAGNOSIS — R109 Unspecified abdominal pain: Secondary | ICD-10-CM | POA: Diagnosis not present

## 2018-09-16 DIAGNOSIS — Z79899 Other long term (current) drug therapy: Secondary | ICD-10-CM | POA: Diagnosis not present

## 2018-09-17 DIAGNOSIS — N23 Unspecified renal colic: Secondary | ICD-10-CM | POA: Diagnosis not present

## 2018-09-17 DIAGNOSIS — R1084 Generalized abdominal pain: Secondary | ICD-10-CM | POA: Diagnosis not present

## 2018-09-17 DIAGNOSIS — R1011 Right upper quadrant pain: Secondary | ICD-10-CM | POA: Diagnosis not present

## 2018-09-18 ENCOUNTER — Telehealth: Payer: Self-pay | Admitting: *Deleted

## 2018-09-18 DIAGNOSIS — Z01818 Encounter for other preprocedural examination: Secondary | ICD-10-CM | POA: Diagnosis not present

## 2018-09-18 DIAGNOSIS — K259 Gastric ulcer, unspecified as acute or chronic, without hemorrhage or perforation: Secondary | ICD-10-CM | POA: Diagnosis not present

## 2018-09-18 NOTE — Telephone Encounter (Signed)
Contacted by Sherri Rad, RMA from [facility]; she would like the results of the pt's COVID results so that he can be scheduled for a procedure; the pt is with her, and gives verbal authorization to release results; she is notified of pt's COVUD negative test results.

## 2018-09-25 DIAGNOSIS — K2270 Barrett's esophagus without dysplasia: Secondary | ICD-10-CM | POA: Diagnosis not present

## 2018-10-01 DIAGNOSIS — R8290 Unspecified abnormal findings in urine: Secondary | ICD-10-CM | POA: Diagnosis not present

## 2018-10-01 DIAGNOSIS — Z1211 Encounter for screening for malignant neoplasm of colon: Secondary | ICD-10-CM | POA: Diagnosis not present

## 2018-10-01 DIAGNOSIS — R1011 Right upper quadrant pain: Secondary | ICD-10-CM | POA: Diagnosis not present

## 2018-10-01 DIAGNOSIS — K253 Acute gastric ulcer without hemorrhage or perforation: Secondary | ICD-10-CM | POA: Diagnosis not present

## 2018-10-24 DIAGNOSIS — K635 Polyp of colon: Secondary | ICD-10-CM | POA: Diagnosis not present

## 2018-10-24 DIAGNOSIS — Z1211 Encounter for screening for malignant neoplasm of colon: Secondary | ICD-10-CM | POA: Diagnosis not present

## 2018-10-24 DIAGNOSIS — Z01818 Encounter for other preprocedural examination: Secondary | ICD-10-CM | POA: Diagnosis not present

## 2018-10-29 DIAGNOSIS — K635 Polyp of colon: Secondary | ICD-10-CM | POA: Diagnosis not present

## 2018-11-11 DIAGNOSIS — E559 Vitamin D deficiency, unspecified: Secondary | ICD-10-CM | POA: Diagnosis not present

## 2018-11-11 DIAGNOSIS — R7989 Other specified abnormal findings of blood chemistry: Secondary | ICD-10-CM | POA: Diagnosis not present

## 2018-11-11 DIAGNOSIS — K253 Acute gastric ulcer without hemorrhage or perforation: Secondary | ICD-10-CM | POA: Diagnosis not present

## 2018-11-11 DIAGNOSIS — Z1159 Encounter for screening for other viral diseases: Secondary | ICD-10-CM | POA: Diagnosis not present

## 2018-12-28 DIAGNOSIS — Z77098 Contact with and (suspected) exposure to other hazardous, chiefly nonmedicinal, chemicals: Secondary | ICD-10-CM | POA: Diagnosis not present

## 2018-12-28 DIAGNOSIS — Z1159 Encounter for screening for other viral diseases: Secondary | ICD-10-CM | POA: Diagnosis not present

## 2018-12-28 DIAGNOSIS — J019 Acute sinusitis, unspecified: Secondary | ICD-10-CM | POA: Diagnosis not present

## 2019-02-18 DIAGNOSIS — Z20828 Contact with and (suspected) exposure to other viral communicable diseases: Secondary | ICD-10-CM | POA: Diagnosis not present

## 2019-02-26 ENCOUNTER — Other Ambulatory Visit: Payer: Self-pay

## 2019-02-26 ENCOUNTER — Encounter: Payer: Self-pay | Admitting: Family Medicine

## 2019-02-26 ENCOUNTER — Ambulatory Visit (INDEPENDENT_AMBULATORY_CARE_PROVIDER_SITE_OTHER): Payer: BC Managed Care – PPO | Admitting: Family Medicine

## 2019-02-26 ENCOUNTER — Ambulatory Visit: Payer: Self-pay

## 2019-02-26 VITALS — BP 134/88 | HR 87 | Ht 68.0 in | Wt 270.0 lb

## 2019-02-26 DIAGNOSIS — M25561 Pain in right knee: Secondary | ICD-10-CM

## 2019-02-26 DIAGNOSIS — M25562 Pain in left knee: Secondary | ICD-10-CM | POA: Diagnosis not present

## 2019-02-26 DIAGNOSIS — G8929 Other chronic pain: Secondary | ICD-10-CM | POA: Diagnosis not present

## 2019-02-26 DIAGNOSIS — M7651 Patellar tendinitis, right knee: Secondary | ICD-10-CM

## 2019-02-26 DIAGNOSIS — M76899 Other specified enthesopathies of unspecified lower limb, excluding foot: Secondary | ICD-10-CM | POA: Insufficient documentation

## 2019-02-26 DIAGNOSIS — M7652 Patellar tendinitis, left knee: Secondary | ICD-10-CM

## 2019-02-26 MED ORDER — COLCHICINE 0.6 MG PO TABS: 0.6000 mg | ORAL_TABLET | Freq: Two times a day (BID) | ORAL | 2 refills | Status: DC

## 2019-02-26 MED ORDER — PENNSAID 2 % EX SOLN: 1.0000 "application " | Freq: Two times a day (BID) | CUTANEOUS | 3 refills | Status: DC

## 2019-02-26 NOTE — Assessment & Plan Note (Signed)
Pain seems to be more occurring over the patellar tendon.  No increased hyperemia observed on ultrasound.  May be related to his work in the shoes that he has to wear and going up and down several stairs. -Pennsaid and sample provided. -Patellar straps. -Counseled on home exercise therapy and supportive care. -Could consider nitro or physical therapy.

## 2019-02-26 NOTE — Patient Instructions (Signed)
Nice to meet you Please try the patellar straps at work  Please try ice  Please try the rub one medicine  Please try the exercises  Please let me know if the colchicine helps. You should take this until you have 2 days of no pain  Please send me a message in MyChart with any questions or updates.  Please see me back in 4 weeks.   --Dr. Raeford Razor

## 2019-02-26 NOTE — Progress Notes (Signed)
Adam Mcneil - 48 y.o. male MRN [id number]  Date of birth: 10-04-1970  SUBJECTIVE:  Including CC & ROS.  Chief Complaint  Patient presents with  . Knee Pain    bilateral knee / right worse    Adam Mcneil is a 48 y.o. male that is presenting with acute on chronic bilateral knee pain.  The right is worse than the left.  He has intermittent swelling and popping.  He is also experiencing pain over the anterior knee at the insertion of the patellar tendon.  He denies any specific inciting event.  Gout does run in his family.  Has little improvement with home modalities.  Denies any history of surgery or injury.  Pain is intermittent in nature.  Seems to be worse at the end of the day.  He has to walk a lot at work.  He has to wear special shoes with no specific insoles.  Pain is localized to the knees.  Feels different than his previous knee pain.  Independent review of the left knee MRI from 2018 shows no meniscal tears, mild degenerative medial joint changes.   Review of Systems  Constitutional: Negative for fever.  HENT: Negative for congestion.   Respiratory: Negative for cough.   Cardiovascular: Negative for chest pain.  Gastrointestinal: Negative for abdominal pain.  Musculoskeletal: Positive for gait problem.  Skin: Negative for color change.  Neurological: Negative for weakness.  Hematological: Negative for adenopathy.    HISTORY: Past Medical, Surgical, Social, and Family History Reviewed & Updated per EMR.   Pertinent Historical Findings include:  Past Medical History:  Diagnosis Date  . Asthma   . Migraine     No past surgical history on file.  No Known Allergies  No family history on file.   Social History   Socioeconomic History  . Marital status: Single    Spouse name: Not on file  . Number of children: Not on file  . Years of education: Not on file  . Highest education level: Not on file  Occupational History  . Not on file  Social Needs  .  Financial resource strain: Not on file  . Food insecurity    Worry: Not on file    Inability: Not on file  . Transportation needs    Medical: Not on file    Non-medical: Not on file  Tobacco Use  . Smoking status: Never Smoker  . Smokeless tobacco: Never Used  Substance and Sexual Activity  . Alcohol use: Yes    Comment: occassional  . Drug use: No  . Sexual activity: Not on file  Lifestyle  . Physical activity    Days per week: Not on file    Minutes per session: Not on file  . Stress: Not on file  Relationships  . Social Musician on phone: Not on file    Gets together: Not on file    Attends religious service: Not on file    Active member of club or organization: Not on file    Attends meetings of clubs or organizations: Not on file    Relationship status: Not on file  . Intimate partner violence    Fear of current or ex partner: Not on file    Emotionally abused: Not on file    Physically abused: Not on file    Forced sexual activity: Not on file  Other Topics Concern  . Not on file  Social History Narrative  . Not  on file     PHYSICAL EXAM:  VS: BP 134/88   Pulse 87   Ht 5\' 8"  (1.727 m)   Wt 270 lb (122.5 kg)   BMI 41.05 kg/m  Physical Exam Gen: NAD, alert, cooperative with exam, well-appearing ENT: normal lips, normal nasal mucosa,  Eye: normal EOM, normal conjunctiva and lids CV:  no edema, +2 pedal pulses   Resp: no accessory muscle use, non-labored,  Skin: no rashes, no areas of induration  Neuro: normal tone, normal sensation to touch Psych:  normal insight, alert and oriented MSK:  Right and left knee: Tenderness to palpation over the insertion of the patellar tendon with right worse than left. Does have more pronounced tibial tubercle on the right than left. Normal range of motion. No tenderness to palpation over the medial joint line. No instability with valgus or varus stress testing. Normal strength resistance. Negative  McMurray's test. No pain with patellar grind. Neurovascularly intact  Limited ultrasound: Right and left knee:  Right knee: No obvious effusion. Normal-appearing quadricep tendon. Appears to be thickening with hypoechoic change of the insertion of the patellar tendon.  No increased hyperemia of this area. No significant narrowing of the medial joint line. Normal appearance of the medial meniscus. Normal appearance of the lateral meniscus.  Left knee: Trace to small effusion with a hypoechoic area which could represent a crystalline gout. Normal-appearing quadricep tendon. There is pronounced change in the tibial tubercle but no specific changes of the patellar tendon. Soon narrowing of the medial joint line with no changes of the medial meniscus. Normal-appearing lateral meniscus and joint line  Summary: Patellar tendinitis on the right knee.  Findings suggestive of possible gout on the left.  Ultrasound and interpretation by Clearance Coots, MD    ASSESSMENT & PLAN:   Patellar tendinitis of both knees Pain seems to be more occurring over the patellar tendon.  No increased hyperemia observed on ultrasound.  May be related to his work in the shoes that he has to wear and going up and down several stairs. -Pennsaid and sample provided. -Patellar straps. -Counseled on home exercise therapy and supportive care. -Could consider nitro or physical therapy.  Chronic pain of both knees Does have a component of swelling on the left more than the right.  He may have gout as it runs in his family.  There was a hypoechoic change observed on ultrasound.  This may be the chronic pain that he experiences. -Colchicine. -If no improvement can consider prednisone or injections.

## 2019-02-26 NOTE — Progress Notes (Signed)
Medication Samples have been provided to the patient.  Drug name: Pennsaid       Strength: 2%        Qty: 2 Boxes  LOT: [id number]   Exp.Date: 01/2020  Dosing instructions: Use a peasize amount and rub gently.  The patient has been instructed regarding the correct time, dose, and frequency of taking this medication, including desired effects and most common side effects.   Sherrie George, [place] 2:24 PM 02/26/2019

## 2019-02-26 NOTE — Assessment & Plan Note (Signed)
Does have a component of swelling on the left more than the right.  He may have gout as it runs in his family.  There was a hypoechoic change observed on ultrasound.  This may be the chronic pain that he experiences. -Colchicine. -If no improvement can consider prednisone or injections.

## 2019-03-26 ENCOUNTER — Ambulatory Visit: Payer: BC Managed Care – PPO | Admitting: Family Medicine

## 2019-04-03 ENCOUNTER — Ambulatory Visit: Payer: BC Managed Care – PPO | Admitting: Family Medicine

## 2019-04-03 NOTE — Progress Notes (Deleted)
  Adam Mcneil - 49 y.o. male MRN [id number]  Date of birth: 11-16-1970  SUBJECTIVE:  Including CC & ROS.  No chief complaint on file.   Adam Mcneil is a 49 y.o. male that is  ***.  ***   Review of Systems See HPI   HISTORY: Past Medical, Surgical, Social, and Family History Reviewed & Updated per EMR.   Pertinent Historical Findings include:  Past Medical History:  Diagnosis Date  . Asthma   . Migraine     No past surgical history on file.  No Known Allergies  No family history on file.   Social History   Socioeconomic History  . Marital status: Single    Spouse name: Not on file  . Number of children: Not on file  . Years of education: Not on file  . Highest education level: Not on file  Occupational History  . Not on file  Tobacco Use  . Smoking status: Never Smoker  . Smokeless tobacco: Never Used  Substance and Sexual Activity  . Alcohol use: Yes    Comment: occassional  . Drug use: No  . Sexual activity: Not on file  Other Topics Concern  . Not on file  Social History Narrative  . Not on file   Social Determinants of Health   Financial Resource Strain:   . Difficulty of Paying Living Expenses: Not on file  Food Insecurity:   . Worried About Programme researcher, broadcasting/film/video in the Last Year: Not on file  . Ran Out of Food in the Last Year: Not on file  Transportation Needs:   . Lack of Transportation (Medical): Not on file  . Lack of Transportation (Non-Medical): Not on file  Physical Activity:   . Days of Exercise per Week: Not on file  . Minutes of Exercise per Session: Not on file  Stress:   . Feeling of Stress : Not on file  Social Connections:   . Frequency of Communication with Friends and Family: Not on file  . Frequency of Social Gatherings with Friends and Family: Not on file  . Attends Religious Services: Not on file  . Active Member of Clubs or Organizations: Not on file  . Attends Banker Meetings: Not on file  .  Marital Status: Not on file  Intimate Partner Violence:   . Fear of Current or Ex-Partner: Not on file  . Emotionally Abused: Not on file  . Physically Abused: Not on file  . Sexually Abused: Not on file     PHYSICAL EXAM:  VS: There were no vitals taken for this visit. Physical Exam Gen: NAD, alert, cooperative with exam, well-appearing ENT: normal lips, normal nasal mucosa,  Eye: normal EOM, normal conjunctiva and lids Skin: no rashes, no areas of induration  Neuro: normal tone, normal sensation to touch Psych:  normal insight, alert and oriented MSK:  ***      ASSESSMENT & PLAN:   No problem-specific Assessment & Plan notes found for this encounter.

## 2020-01-06 ENCOUNTER — Other Ambulatory Visit: Payer: Self-pay

## 2020-01-06 ENCOUNTER — Ambulatory Visit (INDEPENDENT_AMBULATORY_CARE_PROVIDER_SITE_OTHER): Payer: BC Managed Care – PPO | Admitting: Family Medicine

## 2020-01-06 ENCOUNTER — Encounter: Payer: Self-pay | Admitting: Family Medicine

## 2020-01-06 ENCOUNTER — Ambulatory Visit: Payer: Self-pay

## 2020-01-06 VITALS — BP 132/70 | HR 76 | Ht 68.0 in | Wt 275.0 lb

## 2020-01-06 DIAGNOSIS — M76899 Other specified enthesopathies of unspecified lower limb, excluding foot: Secondary | ICD-10-CM | POA: Diagnosis not present

## 2020-01-06 DIAGNOSIS — M659 Synovitis and tenosynovitis, unspecified: Secondary | ICD-10-CM

## 2020-01-06 MED ORDER — PREDNISONE 5 MG PO TABS: ORAL_TABLET | ORAL | 0 refills | Status: DC

## 2020-01-06 NOTE — Progress Notes (Signed)
Medication Samples have been provided to the patient.  Drug name: Duexis       Strength: 800mg /26.6mg         Qty: 1 box  LOT:  Exp.Date: 06/2020  Dosing instructions: take 1 tablet by mouth three (3) times a day.  The patient has been instructed regarding the correct time, dose, and frequency of taking this medication, including desired effects and most common side effects.   07/2020, MA 2:59 PM 01/06/2020

## 2020-01-06 NOTE — Patient Instructions (Signed)
Good to see you Please try the prednisone  Please try ice  Please use the duexis after the prednisone   Please send me a message in MyChart with any questions or updates.  Please see me back in 1-2 weeks.   --Dr. Jordan Likes

## 2020-01-06 NOTE — Assessment & Plan Note (Signed)
Acute and severe in nature.  Does have inflammatory changes over the quad tendon to suggest a tendinitis.  No specific injury.  Does not appear to be associated with the joint itself. -Counseled on supportive care. -Prednisone. -Provided Duexis sample. -Provided work note. -May need to consider lab testing or physical therapy.

## 2020-01-06 NOTE — Progress Notes (Signed)
Adam Mcneil - 49 y.o. male MRN [id number]  Date of birth: Sep 17, 1970  SUBJECTIVE:  Including CC & ROS.  Chief Complaint  Patient presents with  . Knee Pain    left x 3 days    Adam Mcneil is a 49 y.o. male that is presenting with acute left knee pain.  The pain is been ongoing for past 2 days.  It seems to be occurring over the anterior knee and radiates proximally to the hip.  Imaging has been negative thus far.  Denies any specific inciting event.  No history of injury or surgery.  Pain is severe and constant.  Is worse with weightbearing.   Review of Systems See HPI   HISTORY: Past Medical, Surgical, Social, and Family History Reviewed & Updated per EMR.   Pertinent Historical Findings include:  Past Medical History:  Diagnosis Date  . Asthma   . Migraine     No past surgical history on file.  No family history on file.  Social History   Socioeconomic History  . Marital status: Single    Spouse name: Not on file  . Number of children: Not on file  . Years of education: Not on file  . Highest education level: Not on file  Occupational History  . Not on file  Tobacco Use  . Smoking status: Never Smoker  . Smokeless tobacco: Never Used  Vaping Use  . Vaping Use: Never used  Substance and Sexual Activity  . Alcohol use: Yes    Comment: occassional  . Drug use: No  . Sexual activity: Not on file  Other Topics Concern  . Not on file  Social History Narrative  . Not on file   Social Determinants of Health   Financial Resource Strain:   . Difficulty of Paying Living Expenses: Not on file  Food Insecurity:   . Worried About Programme researcher, broadcasting/film/video in the Last Year: Not on file  . Ran Out of Food in the Last Year: Not on file  Transportation Needs:   . Lack of Transportation (Medical): Not on file  . Lack of Transportation (Non-Medical): Not on file  Physical Activity:   . Days of Exercise per Week: Not on file  . Minutes of Exercise per Session: Not on  file  Stress:   . Feeling of Stress : Not on file  Social Connections:   . Frequency of Communication with Friends and Family: Not on file  . Frequency of Social Gatherings with Friends and Family: Not on file  . Attends Religious Services: Not on file  . Active Member of Clubs or Organizations: Not on file  . Attends Banker Meetings: Not on file  . Marital Status: Not on file  Intimate Partner Violence:   . Fear of Current or Ex-Partner: Not on file  . Emotionally Abused: Not on file  . Physically Abused: Not on file  . Sexually Abused: Not on file     PHYSICAL EXAM:  VS: BP 132/70   Pulse 76   Ht 5\' 8"  (1.727 m)   Wt 275 lb (124.7 kg)   BMI 41.81 kg/m  Physical Exam Gen: NAD, alert, cooperative with exam, well-appearing MSK:  Left knee: No obvious effusion. Normal extension. Limited flexion. Tenderness to palpation of the quad tendon. Neurovascularly intact  Limited ultrasound: Left knee:  No effusion suprapatellar pouch. Increased hyperemia over the quad tendon medial section. Normal-appearing patellar tendon. Mild degenerative change of the medial compartment.  Summary: Findings suggest quadriceps tendinitis.  Ultrasound and interpretation by Clare Gandy, MD    ASSESSMENT & PLAN:   Quadriceps tendinitis Acute and severe in nature.  Does have inflammatory changes over the quad tendon to suggest a tendinitis.  No specific injury.  Does not appear to be associated with the joint itself. -Counseled on supportive care. -Prednisone. -Provided Duexis sample. -Provided work note. -May need to consider lab testing or physical therapy.

## 2020-01-14 ENCOUNTER — Ambulatory Visit (INDEPENDENT_AMBULATORY_CARE_PROVIDER_SITE_OTHER): Payer: Worker's Compensation | Admitting: Family Medicine

## 2020-01-14 ENCOUNTER — Ambulatory Visit: Payer: Self-pay

## 2020-01-14 ENCOUNTER — Encounter: Payer: Self-pay | Admitting: Family Medicine

## 2020-01-14 ENCOUNTER — Other Ambulatory Visit: Payer: Self-pay

## 2020-01-14 ENCOUNTER — Ambulatory Visit (HOSPITAL_BASED_OUTPATIENT_CLINIC_OR_DEPARTMENT_OTHER)
Admission: RE | Admit: 2020-01-14 | Discharge: 2020-01-14 | Disposition: A | Discharge status: Home / Self Care | Payer: BC Managed Care – PPO | Source: Ambulatory Visit | Attending: Family Medicine | Admitting: Family Medicine

## 2020-01-14 VITALS — BP 136/84 | HR 98 | Ht 68.0 in | Wt 270.0 lb

## 2020-01-14 DIAGNOSIS — M76899 Other specified enthesopathies of unspecified lower limb, excluding foot: Secondary | ICD-10-CM

## 2020-01-14 DIAGNOSIS — M1712 Unilateral primary osteoarthritis, left knee: Secondary | ICD-10-CM | POA: Diagnosis not present

## 2020-01-14 MED ORDER — TRIAMCINOLONE ACETONIDE 40 MG/ML IJ SUSP
40.0000 mg | Freq: Once | INTRAMUSCULAR | Status: AC
  Administered 2020-01-14: 40 mg via INTRA_ARTICULAR

## 2020-01-14 NOTE — Patient Instructions (Signed)
Good to see you Please try ice  Please try the crutches and brace when walking   Please send me a message in MyChart with any questions or updates.  You can send me the MRI once it is completed so we can go over it.   --Dr. Jordan Likes

## 2020-01-14 NOTE — Progress Notes (Signed)
Adam Mcneil - 49 y.o. male MRN [id number]  Date of birth: [birth year]  SUBJECTIVE:  Including CC & ROS.  Chief Complaint  Patient presents with  . Follow-up    left knee    Adam Mcneil is a 49 y.o. male that is presenting with worsening of his left knee pain.  He also has radiation up into the groin.  He has pain with any form with ambulation.  He has throbbing.  Did not receive any improvement with prednisone or Duexis.  Has a history of left knee injection   Review of Systems See HPI   HISTORY: Past Medical, Surgical, Social, and Family History Reviewed & Updated per EMR.   Pertinent Historical Findings include:  Past Medical History:  Diagnosis Date  . Asthma   . Migraine     No past surgical history on file.  No family history on file.  Social History   Socioeconomic History  . Marital status: Single    Spouse name: Not on file  . Number of children: Not on file  . Years of education: Not on file  . Highest education level: Not on file  Occupational History  . Not on file  Tobacco Use  . Smoking status: Never Smoker  . Smokeless tobacco: Never Used  Vaping Use  . Vaping Use: Never used  Substance and Sexual Activity  . Alcohol use: Yes    Comment: occassional  . Drug use: No  . Sexual activity: Not on file  Other Topics Concern  . Not on file  Social History Narrative  . Not on file   Social Determinants of Health   Financial Resource Strain:   . Difficulty of Paying Living Expenses: Not on file  Food Insecurity:   . Worried About Programme researcher, broadcasting/film/video in the Last Year: Not on file  . Ran Out of Food in the Last Year: Not on file  Transportation Needs:   . Lack of Transportation (Medical): Not on file  . Lack of Transportation (Non-Medical): Not on file  Physical Activity:   . Days of Exercise per Week: Not on file  . Minutes of Exercise per Session: Not on file  Stress:   . Feeling of Stress : Not on file  Social Connections:   .  Frequency of Communication with Friends and Family: Not on file  . Frequency of Social Gatherings with Friends and Family: Not on file  . Attends Religious Services: Not on file  . Active Member of Clubs or Organizations: Not on file  . Attends Banker Meetings: Not on file  . Marital Status: Not on file  Intimate Partner Violence:   . Fear of Current or Ex-Partner: Not on file  . Emotionally Abused: Not on file  . Physically Abused: Not on file  . Sexually Abused: Not on file     PHYSICAL EXAM:  VS: BP 136/84   Pulse 98   Ht 5\' 8"  (1.727 m)   Wt 270 lb (122.5 kg)   BMI 41.05 kg/m  Physical Exam Gen: NAD, alert, cooperative with exam, well-appearing MSK:  Left knee: Normal range of motion. Normal strength resistance. No instability. Neurovascularly intact   Aspiration/Injection Procedure Note Adam Mcneil 09-22-1970  Procedure: Injection Indications: Left knee pain  Procedure Details Consent: Risks of procedure as well as the alternatives and risks of each were explained to the (patient/caregiver).  Consent for procedure obtained. Time Out: Verified patient identification, verified procedure, site/side was marked,  verified correct patient position, special equipment/implants available, medications/allergies/relevent history reviewed, required imaging and test results available.  Performed.  The area was cleaned with iodine and alcohol swabs.    The left knee superior lateral suprapatellar approach was injected using 1 cc's of 40 mg Kenalog and 4 cc's of 0.25% bupivacaine with a 22 1 1/2" needle.  Ultrasound was used. Images were obtained in long views showing the injection.     A sterile dressing was applied.  Patient did tolerate procedure well.     ASSESSMENT & PLAN:   OA (osteoarthritis) of knee He has degenerative changes from previous MRI done in 2018.  This may be playing a component in his pain. -Counseled on home exercise therapy and  supportive care. -Injection. -He does have an MRI ordered from previous physician  Quadriceps tendinitis He does have inflammatory changes on the quadricep as well as the patella.  Unclear if this is more inflammatory.  Possible for gout to be in origin. -Counseled on supportive care. -Could consider uric acid in lab work.

## 2020-01-15 NOTE — Assessment & Plan Note (Signed)
He does have inflammatory changes on the quadricep as well as the patella.  Unclear if this is more inflammatory.  Possible for gout to be in origin. -Counseled on supportive care. -Could consider uric acid in lab work.

## 2020-01-15 NOTE — Assessment & Plan Note (Signed)
He has degenerative changes from previous MRI done in 2018.  This may be playing a component in his pain. -Counseled on home exercise therapy and supportive care. -Injection. -He does have an MRI ordered from previous physician

## 2020-01-22 ENCOUNTER — Ambulatory Visit: Payer: BC Managed Care – PPO | Admitting: Family Medicine

## 2020-01-22 NOTE — Progress Notes (Deleted)
  Adam Mcneil - 49 y.o. male MRN [id number]  Date of birth: March 23, 1971  SUBJECTIVE:  Including CC & ROS.  No chief complaint on file.   Adam Mcneil is a 49 y.o. male that is  ***.  ***   Review of Systems See HPI   HISTORY: Past Medical, Surgical, Social, and Family History Reviewed & Updated per EMR.   Pertinent Historical Findings include:  Past Medical History:  Diagnosis Date  . Asthma   . Migraine     No past surgical history on file.  No family history on file.  Social History   Socioeconomic History  . Marital status: Single    Spouse name: Not on file  . Number of children: Not on file  . Years of education: Not on file  . Highest education level: Not on file  Occupational History  . Not on file  Tobacco Use  . Smoking status: Never Smoker  . Smokeless tobacco: Never Used  Vaping Use  . Vaping Use: Never used  Substance and Sexual Activity  . Alcohol use: Yes    Comment: occassional  . Drug use: No  . Sexual activity: Not on file  Other Topics Concern  . Not on file  Social History Narrative  . Not on file   Social Determinants of Health   Financial Resource Strain:   . Difficulty of Paying Living Expenses: Not on file  Food Insecurity:   . Worried About Programme researcher, broadcasting/film/video in the Last Year: Not on file  . Ran Out of Food in the Last Year: Not on file  Transportation Needs:   . Lack of Transportation (Medical): Not on file  . Lack of Transportation (Non-Medical): Not on file  Physical Activity:   . Days of Exercise per Week: Not on file  . Minutes of Exercise per Session: Not on file  Stress:   . Feeling of Stress : Not on file  Social Connections:   . Frequency of Communication with Friends and Family: Not on file  . Frequency of Social Gatherings with Friends and Family: Not on file  . Attends Religious Services: Not on file  . Active Member of Clubs or Organizations: Not on file  . Attends Banker Meetings: Not on  file  . Marital Status: Not on file  Intimate Partner Violence:   . Fear of Current or Ex-Partner: Not on file  . Emotionally Abused: Not on file  . Physically Abused: Not on file  . Sexually Abused: Not on file     PHYSICAL EXAM:  VS: There were no vitals taken for this visit. Physical Exam Gen: NAD, alert, cooperative with exam, well-appearing MSK:  ***      ASSESSMENT & PLAN:   No problem-specific Assessment & Plan notes found for this encounter.

## 2020-01-30 ENCOUNTER — Ambulatory Visit: Payer: Self-pay

## 2020-01-30 ENCOUNTER — Other Ambulatory Visit: Payer: Self-pay

## 2020-01-30 ENCOUNTER — Ambulatory Visit (HOSPITAL_BASED_OUTPATIENT_CLINIC_OR_DEPARTMENT_OTHER)
Admission: RE | Admit: 2020-01-30 | Discharge: 2020-01-30 | Disposition: A | Discharge status: Home / Self Care | Payer: BC Managed Care – PPO | Source: Ambulatory Visit | Attending: Family Medicine | Admitting: Family Medicine

## 2020-01-30 ENCOUNTER — Ambulatory Visit (INDEPENDENT_AMBULATORY_CARE_PROVIDER_SITE_OTHER): Payer: Worker's Compensation | Admitting: Family Medicine

## 2020-01-30 ENCOUNTER — Encounter: Payer: Self-pay | Admitting: Family Medicine

## 2020-01-30 VITALS — BP 149/93 | HR 73 | Ht 68.0 in | Wt 270.0 lb

## 2020-01-30 DIAGNOSIS — M25552 Pain in left hip: Secondary | ICD-10-CM | POA: Diagnosis not present

## 2020-01-30 DIAGNOSIS — M1712 Unilateral primary osteoarthritis, left knee: Secondary | ICD-10-CM

## 2020-01-30 MED ORDER — TRIAMCINOLONE ACETONIDE 40 MG/ML IJ SUSP
40.0000 mg | Freq: Once | INTRAMUSCULAR | Status: AC
Start: 2020-01-30 — End: 2020-01-30
  Administered 2020-01-30: 40 mg via INTRA_ARTICULAR

## 2020-01-30 NOTE — Patient Instructions (Signed)
Good to see you I will call with the results from today  Please send me a message in MyChart with any questions or updates.  Please see me back in 2 weeks. We may change this base on your findings.   --Dr. Jordan Likes

## 2020-01-30 NOTE — Assessment & Plan Note (Signed)
He reports having inguinal pain as well as it radiating to his back.  This may be more labral issue versus radicular as to the source of his pain. -Counseled on home exercise therapy and supportive care. -Hip injection. -Back x-ray.

## 2020-01-30 NOTE — Assessment & Plan Note (Signed)
His pain over his knee is still occurring.  MRI was demonstrating degenerative changes and fat pad impingement.  He only had mild improvement with the previous knee injection.  He reports the pain radiating more proximally to his hip into his back.  This may be more related to the hip joint itself or radicular type pain. -Counseled on home exercise therapy and supportive care. -Injection. -ANA, sed rate, CRP, uric acid. -Provided work note.

## 2020-01-30 NOTE — Addendum Note (Signed)
Addended by: Kathi Simpers F on: 01/30/2020 10:49 AM   Modules accepted: Orders

## 2020-01-30 NOTE — Progress Notes (Signed)
Adam Mcneil - 49 y.o. male MRN [id number]  Date of birth: [birth year]  SUBJECTIVE:  Including CC & ROS.  Chief Complaint  Patient presents with  . Follow-up    left knee    Adam Mcneil is a 49 y.o. male that is presenting with worsening of his left leg knee pain.  Review of the MRI was demonstrating improving fat pad impingement with degenerative changes appreciated.  He still reports his knee pain is worsening with no improvement with therapy stride.  He reports the pain occurs on the front of his knee and radiates to the groin and up to the back..  Independent review of the left hip x-ray shows slight symmetric narrowing of the joint space.   Review of Systems See HPI   HISTORY: Past Medical, Surgical, Social, and Family History Reviewed & Updated per EMR.   Pertinent Historical Findings include:  Past Medical History:  Diagnosis Date  . Asthma   . Migraine     No past surgical history on file.  No family history on file.  Social History   Socioeconomic History  . Marital status: Single    Spouse name: Not on file  . Number of children: Not on file  . Years of education: Not on file  . Highest education level: Not on file  Occupational History  . Not on file  Tobacco Use  . Smoking status: Never Smoker  . Smokeless tobacco: Never Used  Vaping Use  . Vaping Use: Never used  Substance and Sexual Activity  . Alcohol use: Yes    Comment: occassional  . Drug use: No  . Sexual activity: Not on file  Other Topics Concern  . Not on file  Social History Narrative  . Not on file   Social Determinants of Health   Financial Resource Strain:   . Difficulty of Paying Living Expenses: Not on file  Food Insecurity:   . Worried About Programme researcher, broadcasting/film/video in the Last Year: Not on file  . Ran Out of Food in the Last Year: Not on file  Transportation Needs:   . Lack of Transportation (Medical): Not on file  . Lack of Transportation (Non-Medical): Not on file    Physical Activity:   . Days of Exercise per Week: Not on file  . Minutes of Exercise per Session: Not on file  Stress:   . Feeling of Stress : Not on file  Social Connections:   . Frequency of Communication with Friends and Family: Not on file  . Frequency of Social Gatherings with Friends and Family: Not on file  . Attends Religious Services: Not on file  . Active Member of Clubs or Organizations: Not on file  . Attends Banker Meetings: Not on file  . Marital Status: Not on file  Intimate Partner Violence:   . Fear of Current or Ex-Partner: Not on file  . Emotionally Abused: Not on file  . Physically Abused: Not on file  . Sexually Abused: Not on file     PHYSICAL EXAM:  VS: BP (!) 149/93   Pulse 73   Ht 5\' 8"  (1.727 m)   Wt 270 lb (122.5 kg)   BMI 41.05 kg/m  Physical Exam Gen: NAD, alert, cooperative with exam, well-appearing MSK:  Left leg: Normal knee range of motion. No knee effusion. Normal strength resistance. Normal hip flexion. No tenderness palpation of the greater trochanter. Normal internal and external rotation of the hip. Negative straight leg  raise. Neurovascularly intact   Aspiration/Injection Procedure Note Adam Mcneil [birth year]  Procedure: Injection Indications: Left hip pain  Procedure Details Consent: Risks of procedure as well as the alternatives and risks of each were explained to the (patient/caregiver).  Consent for procedure obtained. Time Out: Verified patient identification, verified procedure, site/side was marked, verified correct patient position, special equipment/implants available, medications/allergies/relevent history reviewed, required imaging and test results available.  Performed.  The area was cleaned with iodine and alcohol swabs.    The left hip joint was injected using 5 cc of 1% lidocaine on a 22-gauge 3-1/2 inch needle.  The syringe was switched and a mixture containing 1 cc's of 40 mg Kenalog and 4  cc's of 0.25% bupivacaine was injected.  Ultrasound was used. Images were obtained in long views showing the injection.     A sterile dressing was applied.  Patient did tolerate procedure well.     ASSESSMENT & PLAN:   OA (osteoarthritis) of knee His pain over his knee is still occurring.  MRI was demonstrating degenerative changes and fat pad impingement.  He only had mild improvement with the previous knee injection.  He reports the pain radiating more proximally to his hip into his back.  This may be more related to the hip joint itself or radicular type pain. -Counseled on home exercise therapy and supportive care. -Injection. -ANA, sed rate, CRP, uric acid. -Provided work note.  Left hip pain He reports having inguinal pain as well as it radiating to his back.  This may be more labral issue versus radicular as to the source of his pain. -Counseled on home exercise therapy and supportive care. -Hip injection. -Back x-ray.

## 2020-02-02 ENCOUNTER — Telehealth: Payer: Self-pay | Admitting: Family Medicine

## 2020-02-02 NOTE — Telephone Encounter (Signed)
Patient called to see if results of Labs or Xray frm 11/5 are in. --Forwarding request to provider for review.  --glh

## 2020-02-03 ENCOUNTER — Telehealth: Payer: Self-pay | Admitting: Family Medicine

## 2020-02-03 LAB — SEDIMENTATION RATE: Sed Rate: 4 mm/hr (ref 0–30)

## 2020-02-03 LAB — URIC ACID: Uric Acid: 8.3 mg/dL (ref 3.8–8.4)

## 2020-02-03 LAB — ANA,IFA RA DIAG PNL W/RFLX TIT/PATN
ANA Titer 1: NEGATIVE
Cyclic Citrullin Peptide Ab: 10 units (ref 0–19)
Rheumatoid fact SerPl-aCnc: <10 IU/mL (ref 0.0–13.9)

## 2020-02-03 LAB — C-REACTIVE PROTEIN: CRP: 1 mg/L (ref 0–10)

## 2020-02-03 NOTE — Telephone Encounter (Signed)
Spoke with patient about results.  He did get significant improvement with the hip injection.  His uric acid was elevated.  It seems more that his hip is the source of his pain.  We will pursue an MRI to evaluate for labral changes if his pain returns.  Myra Rude, MD [facility] 02/03/2020, 9:11 AM

## 2020-02-03 NOTE — Telephone Encounter (Signed)
Patient called back after speaking w/ parent states Mom shared that Gout runs in their family--He says Dr.Schmitz called him earlier today to share lab results.  --FYI to provider.  --glh

## 2020-02-12 ENCOUNTER — Other Ambulatory Visit: Payer: Self-pay

## 2020-02-12 ENCOUNTER — Ambulatory Visit (INDEPENDENT_AMBULATORY_CARE_PROVIDER_SITE_OTHER): Payer: BC Managed Care – PPO | Admitting: Family Medicine

## 2020-02-12 VITALS — BP 122/78 | Ht 68.0 in | Wt 270.0 lb

## 2020-02-12 DIAGNOSIS — M25552 Pain in left hip: Secondary | ICD-10-CM | POA: Diagnosis not present

## 2020-02-12 DIAGNOSIS — M1712 Unilateral primary osteoarthritis, left knee: Secondary | ICD-10-CM | POA: Diagnosis not present

## 2020-02-12 MED ORDER — INDOMETHACIN 50 MG PO CAPS: 50.0000 mg | ORAL_CAPSULE | Freq: Two times a day (BID) | ORAL | 1 refills | Status: DC

## 2020-02-12 NOTE — Assessment & Plan Note (Addendum)
No pain in groin since the injection. May have been irritated with accommodating with his left knee.  - counseled on home exercise therapy and supportive care - referral to PT

## 2020-02-12 NOTE — Patient Instructions (Signed)
Good to see you Please try the indocin. Please do not take with ibuprofen or aleve  Please use ice as needed  Physical therapy will give you a call   Please send me a message in MyChart with any questions or updates.  Please see me back in 4 weeks.   --Dr. Jordan Likes

## 2020-02-12 NOTE — Assessment & Plan Note (Signed)
Knee pain is still ongoing. Limited improvement with injection and measures thus far. Does have an elevated uric acid and could be more gout in origin.  - counseled on home exercise therapy and supportive care - indocin  - referral to PT  - could consider gel injection.

## 2020-02-12 NOTE — Progress Notes (Signed)
Adam Mcneil - 49 y.o. male MRN [id number]  Date of birth: 1970-08-06  SUBJECTIVE:  Including CC & ROS.  No chief complaint on file.   Adam Mcneil is a 49 y.o. male that is presenting with worsening of his left knee pain.  MRI had showed degenerative changes that are mild and fat pad impingement.  His left hip pain has resolved since the injection.   Review of Systems See HPI   HISTORY: Past Medical, Surgical, Social, and Family History Reviewed & Updated per EMR.   Pertinent Historical Findings include:  Past Medical History:  Diagnosis Date  . Asthma   . Migraine     No past surgical history on file.  No family history on file.  Social History   Socioeconomic History  . Marital status: Single    Spouse name: Not on file  . Number of children: Not on file  . Years of education: Not on file  . Highest education level: Not on file  Occupational History  . Not on file  Tobacco Use  . Smoking status: Never Smoker  . Smokeless tobacco: Never Used  Vaping Use  . Vaping Use: Never used  Substance and Sexual Activity  . Alcohol use: Yes    Comment: occassional  . Drug use: No  . Sexual activity: Not on file  Other Topics Concern  . Not on file  Social History Narrative  . Not on file   Social Determinants of Health   Financial Resource Strain:   . Difficulty of Paying Living Expenses: Not on file  Food Insecurity:   . Worried About Programme researcher, broadcasting/film/video in the Last Year: Not on file  . Ran Out of Food in the Last Year: Not on file  Transportation Needs:   . Lack of Transportation (Medical): Not on file  . Lack of Transportation (Non-Medical): Not on file  Physical Activity:   . Days of Exercise per Week: Not on file  . Minutes of Exercise per Session: Not on file  Stress:   . Feeling of Stress : Not on file  Social Connections:   . Frequency of Communication with Friends and Family: Not on file  . Frequency of Social Gatherings with Friends and  Family: Not on file  . Attends Religious Services: Not on file  . Active Member of Clubs or Organizations: Not on file  . Attends Banker Meetings: Not on file  . Marital Status: Not on file  Intimate Partner Violence:   . Fear of Current or Ex-Partner: Not on file  . Emotionally Abused: Not on file  . Physically Abused: Not on file  . Sexually Abused: Not on file     PHYSICAL EXAM:  VS: BP 122/78   Ht 5\' 8"  (1.727 m)   Wt 270 lb (122.5 kg)   BMI 41.05 kg/m  Physical Exam Gen: NAD, alert, cooperative with exam, well-appearing     ASSESSMENT & PLAN:   Left hip pain No pain in groin since the injection. May have been irritated with accommodating with his left knee.  - counseled on home exercise therapy and supportive care - referral to PT   OA (osteoarthritis) of knee Knee pain is still ongoing. Limited improvement with injection and measures thus far. Does have an elevated uric acid and could be more gout in origin.  - counseled on home exercise therapy and supportive care - indocin  - referral to PT  - could consider gel injection.

## 2020-02-27 ENCOUNTER — Ambulatory Visit: Payer: BC Managed Care – PPO | Attending: Family Medicine | Admitting: Physical Therapy

## 2020-03-11 ENCOUNTER — Ambulatory Visit: Payer: BC Managed Care – PPO | Admitting: Family Medicine

## 2020-03-11 NOTE — Progress Notes (Deleted)
  Adam Mcneil - 49 y.o. male MRN [id number]  Date of birth: 08/28/1970  SUBJECTIVE:  Including CC & ROS.  No chief complaint on file.   Adam Mcneil is a 49 y.o. male that is  ***.  ***   Review of Systems See HPI   HISTORY: Past Medical, Surgical, Social, and Family History Reviewed & Updated per EMR.   Pertinent Historical Findings include:  Past Medical History:  Diagnosis Date  . Asthma   . Migraine     No past surgical history on file.  No family history on file.  Social History   Socioeconomic History  . Marital status: Single    Spouse name: Not on file  . Number of children: Not on file  . Years of education: Not on file  . Highest education level: Not on file  Occupational History  . Not on file  Tobacco Use  . Smoking status: Never Smoker  . Smokeless tobacco: Never Used  Vaping Use  . Vaping Use: Never used  Substance and Sexual Activity  . Alcohol use: Yes    Comment: occassional  . Drug use: No  . Sexual activity: Not on file  Other Topics Concern  . Not on file  Social History Narrative  . Not on file   Social Determinants of Health   Financial Resource Strain: Not on file  Food Insecurity: Not on file  Transportation Needs: Not on file  Physical Activity: Not on file  Stress: Not on file  Social Connections: Not on file  Intimate Partner Violence: Not on file     PHYSICAL EXAM:  VS: There were no vitals taken for this visit. Physical Exam Gen: NAD, alert, cooperative with exam, well-appearing MSK:  ***      ASSESSMENT & PLAN:   No problem-specific Assessment & Plan notes found for this encounter.     

## 2020-03-16 ENCOUNTER — Ambulatory Visit: Payer: BC Managed Care – PPO

## 2020-03-24 ENCOUNTER — Emergency Department (HOSPITAL_BASED_OUTPATIENT_CLINIC_OR_DEPARTMENT_OTHER)
Admission: EM | Admit: 2020-03-24 | Discharge: 2020-03-24 | Disposition: A | Discharge status: Home / Self Care | Payer: BC Managed Care – PPO | Attending: Emergency Medicine | Admitting: Emergency Medicine

## 2020-03-24 ENCOUNTER — Other Ambulatory Visit: Payer: Self-pay

## 2020-03-24 ENCOUNTER — Encounter (HOSPITAL_BASED_OUTPATIENT_CLINIC_OR_DEPARTMENT_OTHER): Payer: Self-pay | Admitting: *Deleted

## 2020-03-24 ENCOUNTER — Emergency Department (HOSPITAL_BASED_OUTPATIENT_CLINIC_OR_DEPARTMENT_OTHER): Payer: BC Managed Care – PPO

## 2020-03-24 DIAGNOSIS — J45909 Unspecified asthma, uncomplicated: Secondary | ICD-10-CM | POA: Insufficient documentation

## 2020-03-24 DIAGNOSIS — R0981 Nasal congestion: Secondary | ICD-10-CM | POA: Diagnosis present

## 2020-03-24 DIAGNOSIS — N182 Chronic kidney disease, stage 2 (mild): Secondary | ICD-10-CM | POA: Diagnosis not present

## 2020-03-24 DIAGNOSIS — J069 Acute upper respiratory infection, unspecified: Secondary | ICD-10-CM | POA: Diagnosis not present

## 2020-03-24 MED ORDER — ACETAMINOPHEN 500 MG PO TABS
ORAL_TABLET | ORAL | Status: AC
  Filled 2020-03-24: qty 2

## 2020-03-24 MED ORDER — ALBUTEROL SULFATE HFA 108 (90 BASE) MCG/ACT IN AERS
2.0000 | INHALATION_SPRAY | Freq: Once | RESPIRATORY_TRACT | Status: AC
  Administered 2020-03-24: 2 via RESPIRATORY_TRACT
  Filled 2020-03-24: qty 6.7

## 2020-03-24 NOTE — ED Provider Notes (Signed)
[facility] EMERGENCY DEPARTMENT Provider Note   CSN: [id number] Arrival date & time: 03/24/20  1406     History Chief Complaint  Patient presents with  . Covid Exposure    Adam Mcneil is a 49 y.o. male.  HPI    49 year old male with a history of asthma, migraines, who presents to the emergency department today for evaluation of URI symptoms.  States that yesterday he started having some nasal congestion, sore throat and body aches.  Also having sweats, chills and subjective fevers and a cough.  He was seen at an outside emergency department where he had a Covid test and a flu test that were both reassuring.  He was recommended to take over-the-counter medications for his symptoms and was discharged in stable condition.  He states this morning he had some chest tightness which is why he came to the ED.  He is unvaccinated.  Past Medical History:  Diagnosis Date  . Asthma   . Migraine     Patient Active Problem List   Diagnosis Date Noted  . Left hip pain 01/30/2020  . Quadriceps tendinitis 02/26/2019  . Patellar tendinitis of both knees 02/26/2019  . CKD (chronic kidney disease), stage II 09/13/2018  . Acute pancreatitis   . Acute abdominal pain in right upper quadrant 09/12/2018  . OA (osteoarthritis) of knee 01/23/2017    History reviewed. No pertinent surgical history.     No family history on file.  Social History   Tobacco Use  . Smoking status: Never Smoker  . Smokeless tobacco: Never Used  Vaping Use  . Vaping Use: Never used  Substance Use Topics  . Alcohol use: Yes    Comment: occassional  . Drug use: No    Home Medications Prior to Admission medications   Medication Sig Start Date End Date Taking? Authorizing Provider  colchicine 0.6 MG tablet Take 1 tablet (0.6 mg total) by mouth 2 (two) times daily. 02/26/19   Myra Rude, MD  indomethacin (INDOCIN) 50 MG capsule Take 1 capsule (50 mg total) by mouth 2 (two) times daily with  a meal. 02/12/20   Myra Rude, MD  predniSONE (DELTASONE) 5 MG tablet Take 6 pills for first day, 5 pills second day, 4 pills third day, 3 pills fourth day, 2 pills the fifth day, and 1 pill sixth day. 01/06/20   Myra Rude, MD    Allergies    Patient has no known allergies.  Review of Systems   Review of Systems  Constitutional: Positive for chills and diaphoresis. Negative for fever.  HENT: Positive for congestion, rhinorrhea and sore throat. Negative for ear pain.   Eyes: Negative for visual disturbance.  Respiratory: Positive for cough. Negative for shortness of breath.   Cardiovascular: Negative for chest pain.       Chest tightness  Gastrointestinal: Negative for abdominal pain, constipation, diarrhea, nausea and vomiting.  Genitourinary: Negative for dysuria and hematuria.  Musculoskeletal: Positive for myalgias.  Skin: Negative for rash.  Neurological: Negative for headaches.  All other systems reviewed and are negative.   Physical Exam Updated Vital Signs BP 130/83 (BP Location: Right Arm)   Pulse 62   Temp 98.1 F (36.7 C) (Oral)   Resp 18   Ht 5\' 8"  (1.727 m)   Wt 124.7 kg   SpO2 98%   BMI 41.81 kg/m   Physical Exam Vitals and nursing note reviewed.  Constitutional:      Appearance: He is well-developed  and well-nourished.  HENT:     Head: Normocephalic and atraumatic.  Eyes:     Conjunctiva/sclera: Conjunctivae normal.  Cardiovascular:     Rate and Rhythm: Normal rate and regular rhythm.     Heart sounds: Normal heart sounds. No murmur heard.   Pulmonary:     Effort: Pulmonary effort is normal. No respiratory distress.     Breath sounds: Normal breath sounds. No wheezing, rhonchi or rales.  Abdominal:     Palpations: Abdomen is soft.     Tenderness: There is no abdominal tenderness. There is no guarding or rebound.  Musculoskeletal:        General: No edema.     Cervical back: Neck supple.  Skin:    General: Skin is warm and dry.   Neurological:     Mental Status: He is alert.  Psychiatric:        Mood and Affect: Mood and affect normal.     ED Results / Procedures / Treatments   Labs (all labs ordered are listed, but only abnormal results are displayed) Labs Reviewed - No data to display  EKG None  Radiology DG Chest Portable 1 View  Result Date: 03/24/2020 CLINICAL DATA:  COVID cough and chest pain EXAM: PORTABLE CHEST 1 VIEW COMPARISON:  06/01/2016, 07/28/2017 FINDINGS: The heart size and mediastinal contours are within normal limits. Both lungs are clear. The visualized skeletal structures are unremarkable. IMPRESSION: No active disease. Electronically Signed   By: Jasmine Pang M.D.   On: 03/24/2020 15:37    Procedures Procedures (including critical care time)  Medications Ordered in ED Medications  acetaminophen (TYLENOL) 500 MG tablet (has no administration in time range)  albuterol (VENTOLIN HFA) 108 (90 Base) MCG/ACT inhaler 2 puff (2 puffs Inhalation Given 03/24/20 1918)    ED Course  I have reviewed the triage vital signs and the nursing notes.  Pertinent labs & imaging results that were available during my care of the patient were reviewed by me and considered in my medical decision making (see chart for details).    MDM Rules/Calculators/A&P                          Patient presenting for evaluation for Covid.  Reports symptoms ongoing for 2 days.  Patient nontoxic, well-appearing, no distress.  Vital signs are reassuring.  Chest x-ray negative. He was seen in an outside ED yesterday and had a negative covid and flu. He was advised to use otc meds for sxs but he got worried due to his chest tightness today. He has a h/o asthma. His vs are reassuring. He is not c/o chest pain and his sxs do not seem consistent with ACS, PE or other emergent cause. I suspect it is due to his URI. Will give albuterol. Advised on f/u and return precautions. Pt voiced understanding of the plan and reasons to  return. All questions answered, pt stable for d/c.   Final Clinical Impression(s) / ED Diagnoses Final diagnoses:  Upper respiratory tract infection, unspecified type    Rx / DC Orders ED Discharge Orders    None       Rayne Du 03/24/20 1926    Glynn Octave, MD 03/24/20 2304

## 2020-03-24 NOTE — Discharge Instructions (Signed)
Take two puffs of the albuterol inhaler every 4 hours as needed for cough, chest tightness, shortness of breath, or wheezing.   Please follow up with your primary care provider within 5-7 days for re-evaluation of your symptoms. If you do not have a primary care provider, information for a healthcare clinic has been provided for you to make arrangements for follow up care. Please return to the emergency department for any new or worsening symptoms including shortness of breath or chest pain.

## 2020-03-24 NOTE — ED Triage Notes (Signed)
C/o covid sytmpoms and exposure x 3 days

## 2020-04-22 ENCOUNTER — Ambulatory Visit: Payer: BC Managed Care – PPO | Admitting: Family Medicine

## 2020-04-22 NOTE — Progress Notes (Deleted)
  ESTANISLADO SURGEON - 50 y.o. male MRN [id number]  Date of birth: 1971-02-14  SUBJECTIVE:  Including CC & ROS.  No chief complaint on file.   Adam Mcneil is a 50 y.o. male that is  ***.  ***   Review of Systems See HPI   HISTORY: Past Medical, Surgical, Social, and Family History Reviewed & Updated per EMR.   Pertinent Historical Findings include:  Past Medical History:  Diagnosis Date  . Asthma   . Migraine     No past surgical history on file.  No family history on file.  Social History   Socioeconomic History  . Marital status: Single    Spouse name: Not on file  . Number of children: Not on file  . Years of education: Not on file  . Highest education level: Not on file  Occupational History  . Not on file  Tobacco Use  . Smoking status: Never Smoker  . Smokeless tobacco: Never Used  Vaping Use  . Vaping Use: Never used  Substance and Sexual Activity  . Alcohol use: Yes    Comment: occassional  . Drug use: No  . Sexual activity: Not on file  Other Topics Concern  . Not on file  Social History Narrative  . Not on file   Social Determinants of Health   Financial Resource Strain: Not on file  Food Insecurity: Not on file  Transportation Needs: Not on file  Physical Activity: Not on file  Stress: Not on file  Social Connections: Not on file  Intimate Partner Violence: Not on file     PHYSICAL EXAM:  VS: There were no vitals taken for this visit. Physical Exam Gen: NAD, alert, cooperative with exam, well-appearing MSK:  ***      ASSESSMENT & PLAN:   No problem-specific Assessment & Plan notes found for this encounter.

## 2020-10-28 ENCOUNTER — Other Ambulatory Visit: Payer: Self-pay

## 2020-10-28 ENCOUNTER — Emergency Department (HOSPITAL_BASED_OUTPATIENT_CLINIC_OR_DEPARTMENT_OTHER)
Admission: EM | Admit: 2020-10-28 | Discharge: 2020-10-29 | Disposition: A | Discharge status: Home / Self Care | Payer: BC Managed Care – PPO | Attending: Emergency Medicine | Admitting: Emergency Medicine

## 2020-10-28 ENCOUNTER — Encounter (HOSPITAL_BASED_OUTPATIENT_CLINIC_OR_DEPARTMENT_OTHER): Payer: Self-pay

## 2020-10-28 DIAGNOSIS — S86911A Strain of unspecified muscle(s) and tendon(s) at lower leg level, right leg, initial encounter: Secondary | ICD-10-CM | POA: Diagnosis not present

## 2020-10-28 DIAGNOSIS — Y9361 Activity, american tackle football: Secondary | ICD-10-CM | POA: Diagnosis not present

## 2020-10-28 DIAGNOSIS — S8991XA Unspecified injury of right lower leg, initial encounter: Secondary | ICD-10-CM | POA: Diagnosis present

## 2020-10-28 DIAGNOSIS — X58XXXA Exposure to other specified factors, initial encounter: Secondary | ICD-10-CM | POA: Insufficient documentation

## 2020-10-28 DIAGNOSIS — N182 Chronic kidney disease, stage 2 (mild): Secondary | ICD-10-CM | POA: Diagnosis not present

## 2020-10-28 DIAGNOSIS — J45909 Unspecified asthma, uncomplicated: Secondary | ICD-10-CM | POA: Insufficient documentation

## 2020-10-28 NOTE — ED Notes (Signed)
Felt a "pop" in R calf this evening while working out. Now has pain in calf muscle. Denies ankl,  achilles or knee pain.

## 2020-10-28 NOTE — ED Triage Notes (Signed)
Was playing football with kids and something popped in right calf.  Patient having pain with walking and difficulty walking.

## 2020-10-29 ENCOUNTER — Ambulatory Visit: Payer: Self-pay

## 2020-10-29 ENCOUNTER — Ambulatory Visit (INDEPENDENT_AMBULATORY_CARE_PROVIDER_SITE_OTHER): Payer: BC Managed Care – PPO | Admitting: Family Medicine

## 2020-10-29 ENCOUNTER — Encounter: Payer: Self-pay | Admitting: Family Medicine

## 2020-10-29 VITALS — BP 130/102 | Ht 68.0 in | Wt 260.0 lb

## 2020-10-29 DIAGNOSIS — M79604 Pain in right leg: Secondary | ICD-10-CM

## 2020-10-29 DIAGNOSIS — S86819A Strain of other muscle(s) and tendon(s) at lower leg level, unspecified leg, initial encounter: Secondary | ICD-10-CM | POA: Insufficient documentation

## 2020-10-29 DIAGNOSIS — S86811A Strain of other muscle(s) and tendon(s) at lower leg level, right leg, initial encounter: Secondary | ICD-10-CM

## 2020-10-29 MED ORDER — ACETAMINOPHEN 500 MG PO TABS
1000.0000 mg | ORAL_TABLET | Freq: Once | ORAL | Status: AC
  Administered 2020-10-29: 1000 mg via ORAL
  Filled 2020-10-29: qty 2

## 2020-10-29 MED ORDER — KETOROLAC TROMETHAMINE 60 MG/2ML IM SOLN
30.0000 mg | Freq: Once | INTRAMUSCULAR | Status: AC
  Administered 2020-10-29: 30 mg via INTRAMUSCULAR
  Filled 2020-10-29: qty 2

## 2020-10-29 NOTE — Assessment & Plan Note (Signed)
Has mild changes appreciated on ultrasound. -Provided heel lift for cam walker. -Counseled on home exercise therapy and supportive care. -Counseled on compression. -Provided work note. -Could consider physical therapy.

## 2020-10-29 NOTE — Progress Notes (Signed)
  Adam Mcneil - 50 y.o. male MRN [id number]  Date of birth: 10/20/1970  SUBJECTIVE:  Including CC & ROS.  No chief complaint on file.   Adam Mcneil is a 50 y.o. male that is presenting with acute right calf pain.  He felt the pain when he was pushing off during a sprint.  He is having pain in the mid calf.  No history of similar pain..   Review of Systems See HPI   HISTORY: Past Medical, Surgical, Social, and Family History Reviewed & Updated per EMR.   Pertinent Historical Findings include:  Past Medical History:  Diagnosis Date   Asthma    Migraine     History reviewed. No pertinent surgical history.  History reviewed. No pertinent family history.  Social History   Socioeconomic History   Marital status: Single    Spouse name: Not on file   Number of children: Not on file   Years of education: Not on file   Highest education level: Not on file  Occupational History   Not on file  Tobacco Use   Smoking status: Never   Smokeless tobacco: Never  Vaping Use   Vaping Use: Never used  Substance and Sexual Activity   Alcohol use: Yes    Comment: occassional   Drug use: No   Sexual activity: Not on file  Other Topics Concern   Not on file  Social History Narrative   Not on file   Social Determinants of Health   Financial Resource Strain: Not on file  Food Insecurity: Not on file  Transportation Needs: Not on file  Physical Activity: Not on file  Stress: Not on file  Social Connections: Not on file  Intimate Partner Violence: Not on file     PHYSICAL EXAM:  VS: BP (!) 130/102 (BP Location: Left Arm, Patient Position: Sitting, Cuff Size: Large)   Ht 5\' 8"  (1.727 m)   Wt 260 lb (117.9 kg)   BMI 39.53 kg/m  Physical Exam Gen: NAD, alert, cooperative with exam, well-appearing MSK:  Right calf: Tenderness to palpation in the middle gastrocnemius. Plantarflexion with test. Swelling appreciated. Neurovascular intact  Limited ultrasound:  Right leg:  Normal-appearing Achilles tendon. There is changes in the musculotendinous junction of the medial gastrocnemius. No change of the soleus  Summary: Calf strain  Ultrasound and interpretation by Janee Morn, MD    ASSESSMENT & PLAN:   Strain of calf muscle Has mild changes appreciated on ultrasound. -Provided heel lift for cam walker. -Counseled on home exercise therapy and supportive care. -Counseled on compression. -Provided work note. -Could consider physical therapy.

## 2020-10-29 NOTE — Patient Instructions (Signed)
Good to see you Please try the heel lift  Please try compression  Please try heat   Please send me a message in MyChart with any questions or updates.  Please see me back in 2 weeks.   --Dr. Jordan Likes

## 2020-10-29 NOTE — Discharge Instructions (Addendum)
You may use over-the-counter Motrin (Ibuprofen), Acetaminophen (Tylenol), topical muscle creams such as SalonPas, Icy Hot, Bengay, etc. Please stretch, apply ice or heat (whichever helps)..  

## 2020-10-29 NOTE — ED Provider Notes (Signed)
[facility] EMERGENCY DEPARTMENT Provider Note  CSN: [id number] Arrival date & time: 10/28/20 2255  Chief Complaint(s) Leg Pain  HPI Adam Mcneil is a 50 y.o. male    Leg Pain Location:  Leg Injury: yes   Mechanism of injury comment:  Felt a pop in right calf while playing football Leg location:  R lower leg Pain details:    Quality:  Aching and throbbing   Radiates to:  Does not radiate   Severity:  Moderate   Onset quality:  Sudden   Timing:  Constant Chronicity:  New Dislocation: no   Associated symptoms: no decreased ROM, no numbness, no stiffness and no swelling    Past Medical History Past Medical History:  Diagnosis Date   Asthma    Migraine    Patient Active Problem List   Diagnosis Date Noted   Left hip pain 01/30/2020   Quadriceps tendinitis 02/26/2019   Patellar tendinitis of both knees 02/26/2019   CKD (chronic kidney disease), stage II 09/13/2018   Acute pancreatitis    Acute abdominal pain in right upper quadrant 09/12/2018   OA (osteoarthritis) of knee 01/23/2017   Home Medication(s) Prior to Admission medications   Medication Sig Start Date End Date Taking? Authorizing Provider  colchicine 0.6 MG tablet Take 1 tablet (0.6 mg total) by mouth 2 (two) times daily. 02/26/19   Myra Rude, MD  indomethacin (INDOCIN) 50 MG capsule Take 1 capsule (50 mg total) by mouth 2 (two) times daily with a meal. 02/12/20   Myra Rude, MD  predniSONE (DELTASONE) 5 MG tablet Take 6 pills for first day, 5 pills second day, 4 pills third day, 3 pills fourth day, 2 pills the fifth day, and 1 pill sixth day. 01/06/20   Myra Rude, MD                                                                                                                                    Past Surgical History History reviewed. No pertinent surgical history. Family History History reviewed. No pertinent family history.  Social History Social History   Tobacco  Use   Smoking status: Never   Smokeless tobacco: Never  Vaping Use   Vaping Use: Never used  Substance Use Topics   Alcohol use: Yes    Comment: occassional   Drug use: No   Allergies Patient has no known allergies.  Review of Systems Review of Systems  Musculoskeletal:  Negative for stiffness.  All other systems are reviewed and are negative for acute change except as noted in the HPI  Physical Exam Vital Signs  I have reviewed the triage vital signs BP 113/84 (BP Location: Right Arm)   Pulse 91   Temp 98.4 F (36.9 C) (Oral)   Resp 18   Ht 5\' 8"  (1.727 m)   Wt 117.9 kg   SpO2 99%   BMI 39.53 kg/m  Physical Exam Vitals reviewed.  Constitutional:      General: He is not in acute distress.    Appearance: He is well-developed. He is not diaphoretic.  HENT:     Head: Normocephalic and atraumatic.     Right Ear: External ear normal.     Left Ear: External ear normal.     Nose: Nose normal.     Mouth/Throat:     Mouth: Mucous membranes are moist.  Eyes:     General: No scleral icterus.    Conjunctiva/sclera: Conjunctivae normal.  Neck:     Trachea: Phonation normal.  Cardiovascular:     Rate and Rhythm: Normal rate and regular rhythm.  Pulmonary:     Effort: Pulmonary effort is normal. No respiratory distress.     Breath sounds: No stridor.  Abdominal:     General: There is no distension.  Musculoskeletal:        General: Normal range of motion.     Cervical back: Normal range of motion.     Right knee: No swelling or bony tenderness. No tenderness.     Right lower leg: Tenderness present. No swelling, deformity or bony tenderness. No edema.     Right ankle:     Right Achilles Tendon: Normal.       Legs:  Neurological:     Mental Status: He is alert and oriented to person, place, and time.  Psychiatric:        Behavior: Behavior normal.    ED Results and Treatments Labs (all labs ordered are listed, but only abnormal results are displayed) Labs  Reviewed - No data to display                                                                                                                       EKG  EKG Interpretation  Date/Time:    Ventricular Rate:    PR Interval:    QRS Duration:   QT Interval:    QTC Calculation:   R Axis:     Text Interpretation:         Radiology No results found.  Pertinent labs & imaging results that were available during my care of the patient were reviewed by me and considered in my medical decision making (see MDM for details).  Medications Ordered in ED Medications  acetaminophen (TYLENOL) tablet 1,000 mg (has no administration in time range)  ketorolac (TORADOL) injection 30 mg (has no administration in time range)  Procedures Procedures  (including critical care time)  Medical Decision Making / ED Course I have reviewed the nursing notes for this encounter and the patient's prior records (if available in EHR or on provided paperwork).  Adam Mcneil was evaluated in Emergency Department on 10/29/2020 for the symptoms described in the history of present illness. He was evaluated in the context of the global COVID-19 pandemic, which necessitated consideration that the patient might be at risk for infection with the SARS-CoV-2 virus that causes COVID-19. Institutional protocols and algorithms that pertain to the evaluation of patients at risk for COVID-19 are in a state of rapid change based on information released by regulatory bodies including the CDC and federal and state organizations. These policies and algorithms were followed during the patient's care in the ED.     Most consistent with muscle tear. Not consistent with Achilles tendon rupture. Doubt ACL/PCL tear. Patient provided with cam walker. Tylenol and Toradol for pain control. RICE  recommended. PCP/sports medicine follow-up.    Final Clinical Impression(s) / ED Diagnoses Final diagnoses:  Muscle strain of right lower leg, initial encounter   The patient appears reasonably screened and/or stabilized for discharge and I doubt any other medical condition or other [facility] requiring further screening, evaluation, or treatment in the ED at this time prior to discharge. Safe for discharge with strict return precautions.  Disposition: Discharge  Condition: Good  I have discussed the results, Dx and Tx plan with the patient/family who expressed understanding and agree(s) with the plan. Discharge instructions discussed at length. The patient/family was given strict return precautions who verbalized understanding of the instructions. No further questions at time of discharge.    ED Discharge Orders     None        Follow Up: Primary care provider  Call  in 1-2 weeks     This chart was dictated using voice recognition software.  Despite best efforts to proofread,  errors can occur which can change the documentation meaning.    Nira Conn, MD 10/29/20 [phone]

## 2020-11-12 ENCOUNTER — Other Ambulatory Visit: Payer: Self-pay

## 2020-11-12 ENCOUNTER — Ambulatory Visit (INDEPENDENT_AMBULATORY_CARE_PROVIDER_SITE_OTHER): Payer: BC Managed Care – PPO | Admitting: Family Medicine

## 2020-11-12 DIAGNOSIS — S86811D Strain of other muscle(s) and tendon(s) at lower leg level, right leg, subsequent encounter: Secondary | ICD-10-CM

## 2020-11-12 NOTE — Progress Notes (Signed)
  Adam Mcneil - 50 y.o. male MRN [id number]  Date of birth: December 09, 1970  SUBJECTIVE:  Including CC & ROS.  No chief complaint on file.   Adam Mcneil is a 50 y.o. male that is following up for his calf strain.  He is getting improvement but still feels limited in his range of motion and strength.   Review of Systems See HPI   HISTORY: Past Medical, Surgical, Social, and Family History Reviewed & Updated per EMR.   Pertinent Historical Findings include:  Past Medical History:  Diagnosis Date   Asthma    Migraine     No past surgical history on file.  No family history on file.  Social History   Socioeconomic History   Marital status: Single    Spouse name: Not on file   Number of children: Not on file   Years of education: Not on file   Highest education level: Not on file  Occupational History   Not on file  Tobacco Use   Smoking status: Never   Smokeless tobacco: Never  Vaping Use   Vaping Use: Never used  Substance and Sexual Activity   Alcohol use: Yes    Comment: occassional   Drug use: No   Sexual activity: Not on file  Other Topics Concern   Not on file  Social History Narrative   Not on file   Social Determinants of Health   Financial Resource Strain: Not on file  Food Insecurity: Not on file  Transportation Needs: Not on file  Physical Activity: Not on file  Stress: Not on file  Social Connections: Not on file  Intimate Partner Violence: Not on file     PHYSICAL EXAM:  VS: Ht 5\' 8"  (1.727 m)   Wt 260 lb (117.9 kg)   BMI 39.53 kg/m  Physical Exam Gen: NAD, alert, cooperative with exam, well-appearing    ASSESSMENT & PLAN:   Strain of calf muscle Having improvement but still limited in strength and function. -Counseled on home exercise therapy and supportive care. -Provided work note. -Could consider physical therapy.

## 2020-11-12 NOTE — Patient Instructions (Signed)
Good to see you Pleas adjust the height of the step  Please continue the exercises and heat   Please send me a message in MyChart with any questions or updates.  Please see me back in 2 weeks.   --Dr. Jordan Likes

## 2020-11-12 NOTE — Assessment & Plan Note (Signed)
Having improvement but still limited in strength and function. -Counseled on home exercise therapy and supportive care. -Provided work note. -Could consider physical therapy.

## 2020-11-30 ENCOUNTER — Encounter: Payer: Self-pay | Admitting: Family Medicine

## 2020-11-30 ENCOUNTER — Ambulatory Visit (INDEPENDENT_AMBULATORY_CARE_PROVIDER_SITE_OTHER): Payer: BC Managed Care – PPO | Admitting: Family Medicine

## 2020-11-30 ENCOUNTER — Other Ambulatory Visit: Payer: Self-pay

## 2020-11-30 DIAGNOSIS — S86811D Strain of other muscle(s) and tendon(s) at lower leg level, right leg, subsequent encounter: Secondary | ICD-10-CM | POA: Diagnosis not present

## 2020-11-30 NOTE — Assessment & Plan Note (Signed)
Has been about a month since his initial injury.  Has improvement of the swelling and pain and function. -Counseled on home exercise therapy and supportive care. -Can follow-up as needed.

## 2020-11-30 NOTE — Progress Notes (Signed)
  Adam Mcneil - 50 y.o. male MRN [id number]  Date of birth: 09-18-1970  SUBJECTIVE:  Including CC & ROS.  No chief complaint on file.   Adam Mcneil is a 50 y.o. male that is following up for his right calf strain.  He reports feeling much improvement.  Having no swelling.   Review of Systems See HPI   HISTORY: Past Medical, Surgical, Social, and Family History Reviewed & Updated per EMR.   Pertinent Historical Findings include:  Past Medical History:  Diagnosis Date   Asthma    Migraine     History reviewed. No pertinent surgical history.  History reviewed. No pertinent family history.  Social History   Socioeconomic History   Marital status: Single    Spouse name: Not on file   Number of children: Not on file   Years of education: Not on file   Highest education level: Not on file  Occupational History   Not on file  Tobacco Use   Smoking status: Never   Smokeless tobacco: Never  Vaping Use   Vaping Use: Never used  Substance and Sexual Activity   Alcohol use: Yes    Comment: occassional   Drug use: No   Sexual activity: Not on file  Other Topics Concern   Not on file  Social History Narrative   Not on file   Social Determinants of Health   Financial Resource Strain: Not on file  Food Insecurity: Not on file  Transportation Needs: Not on file  Physical Activity: Not on file  Stress: Not on file  Social Connections: Not on file  Intimate Partner Violence: Not on file     PHYSICAL EXAM:  VS: Ht 5\' 8"  (1.727 m)   Wt 260 lb (117.9 kg)   BMI 39.53 kg/m  Physical Exam Gen: NAD, alert, cooperative with exam, well-appearing     ASSESSMENT & PLAN:   Strain of calf muscle Has been about a month since his initial injury.  Has improvement of the swelling and pain and function. -Counseled on home exercise therapy and supportive care. -Can follow-up as needed.

## 2020-12-14 ENCOUNTER — Emergency Department (HOSPITAL_BASED_OUTPATIENT_CLINIC_OR_DEPARTMENT_OTHER)
Admission: EM | Admit: 2020-12-14 | Discharge: 2020-12-14 | Disposition: A | Discharge status: Home / Self Care | Payer: BC Managed Care – PPO | Attending: Emergency Medicine | Admitting: Emergency Medicine

## 2020-12-14 ENCOUNTER — Ambulatory Visit (INDEPENDENT_AMBULATORY_CARE_PROVIDER_SITE_OTHER): Payer: BC Managed Care – PPO | Admitting: Family Medicine

## 2020-12-14 ENCOUNTER — Other Ambulatory Visit: Payer: Self-pay

## 2020-12-14 ENCOUNTER — Emergency Department (HOSPITAL_BASED_OUTPATIENT_CLINIC_OR_DEPARTMENT_OTHER): Payer: BC Managed Care – PPO

## 2020-12-14 ENCOUNTER — Ambulatory Visit: Payer: Self-pay

## 2020-12-14 ENCOUNTER — Encounter (HOSPITAL_BASED_OUTPATIENT_CLINIC_OR_DEPARTMENT_OTHER): Payer: Self-pay | Admitting: Emergency Medicine

## 2020-12-14 VITALS — Ht 68.0 in | Wt 265.0 lb

## 2020-12-14 DIAGNOSIS — N182 Chronic kidney disease, stage 2 (mild): Secondary | ICD-10-CM | POA: Insufficient documentation

## 2020-12-14 DIAGNOSIS — M7662 Achilles tendinitis, left leg: Secondary | ICD-10-CM | POA: Insufficient documentation

## 2020-12-14 DIAGNOSIS — M19072 Primary osteoarthritis, left ankle and foot: Secondary | ICD-10-CM | POA: Insufficient documentation

## 2020-12-14 DIAGNOSIS — M25572 Pain in left ankle and joints of left foot: Secondary | ICD-10-CM | POA: Diagnosis present

## 2020-12-14 DIAGNOSIS — J45909 Unspecified asthma, uncomplicated: Secondary | ICD-10-CM | POA: Insufficient documentation

## 2020-12-14 DIAGNOSIS — M25472 Effusion, left ankle: Secondary | ICD-10-CM | POA: Diagnosis not present

## 2020-12-14 DIAGNOSIS — M7660 Achilles tendinitis, unspecified leg: Secondary | ICD-10-CM

## 2020-12-14 MED ORDER — IBUPROFEN 400 MG PO TABS
600.0000 mg | ORAL_TABLET | Freq: Once | ORAL | Status: AC
  Administered 2020-12-14: 600 mg via ORAL
  Filled 2020-12-14: qty 1

## 2020-12-14 MED ORDER — TRIAMCINOLONE ACETONIDE 40 MG/ML IJ SUSP
40.0000 mg | Freq: Once | INTRAMUSCULAR | Status: AC
  Administered 2020-12-14: 40 mg via INTRA_ARTICULAR

## 2020-12-14 MED ORDER — NAPROXEN 375 MG PO TABS: 375.0000 mg | ORAL_TABLET | Freq: Two times a day (BID) | ORAL | 0 refills | Status: DC

## 2020-12-14 NOTE — ED Notes (Signed)
PT understood all instructions given for care concerning the CAM boot. PT has no further questions at this time.

## 2020-12-14 NOTE — ED Notes (Signed)
ED Provider at bedside. Goldston MD. 

## 2020-12-14 NOTE — ED Triage Notes (Signed)
Pt awoke with left ankle pain. No injury

## 2020-12-14 NOTE — Discharge Instructions (Addendum)
You are being prescribed Naproxen. Do not take Ibuprofen/Advil/Aleve/Motrin/Goody Powders/BC powders/Meloxicam/Diclofenac/Indomethacin and other Nonsteroidal anti-inflammatory medications   

## 2020-12-14 NOTE — Progress Notes (Signed)
  Adam Mcneil - 50 y.o. male MRN [id number]  Date of birth: 05/28/1970  SUBJECTIVE:  Including CC & ROS.  No chief complaint on file.   Adam Mcneil is a 50 y.o. male that is presenting with left ankle pain.  He is having stiffness and soreness around the ankle.  Denies any injury or inciting event.   Review of Systems See HPI   HISTORY: Past Medical, Surgical, Social, and Family History Reviewed & Updated per EMR.   Pertinent Historical Findings include:  Past Medical History:  Diagnosis Date   Asthma    Migraine     No past surgical history on file.  No family history on file.  Social History   Socioeconomic History   Marital status: Single    Spouse name: Not on file   Number of children: Not on file   Years of education: Not on file   Highest education level: Not on file  Occupational History   Not on file  Tobacco Use   Smoking status: Never   Smokeless tobacco: Never  Vaping Use   Vaping Use: Never used  Substance and Sexual Activity   Alcohol use: Yes    Comment: occassional   Drug use: No   Sexual activity: Not on file  Other Topics Concern   Not on file  Social History Narrative   Not on file   Social Determinants of Health   Financial Resource Strain: Not on file  Food Insecurity: Not on file  Transportation Needs: Not on file  Physical Activity: Not on file  Stress: Not on file  Social Connections: Not on file  Intimate Partner Violence: Not on file     PHYSICAL EXAM:  VS: Ht 5\' 8"  (1.727 m)   Wt 265 lb (120.2 kg)   BMI 40.29 kg/m  Physical Exam Gen: NAD, alert, cooperative with exam, well-appearing   Limited ultrasound: Left ankle and Achilles:  Mild thickening of the Achilles tendon with no hyperemia. No retrocalcaneal bursitis. There is degenerative change and effusion of the ankle joint. No changes of posterior tibialis  Summary: Ankle effusion  Ultrasound and interpretation by ,  MD   Aspiration/Injection Procedure Note Adam Mcneil [birth year]  Procedure: Injection Indications: Left ankle pain  Procedure Details Consent: Risks of procedure as well as the alternatives and risks of each were explained to the (patient/caregiver).  Consent for procedure obtained. Time Out: Verified patient identification, verified procedure, site/side was marked, verified correct patient position, special equipment/implants available, medications/allergies/relevent history reviewed, required imaging and test results available.  Performed.  The area was cleaned with iodine and alcohol swabs.    The left ankle joint was injected using 1 cc's of 40 mg Kenalog and 2 cc's of 0.25% bupivacaine with a 25 1 1/2" needle.  Ultrasound was used. Images were obtained in short views showing the injection.     A sterile dressing was applied.  Patient did tolerate procedure well.     ASSESSMENT & PLAN:   Ankle effusion, left Symptoms seem more consistent with effusion noted at the ankle joint.  No changes acutely occurring of the Achilles tendon itself. -Counseled on home exercise therapy and supportive care. -Injection today. -Could consider physical therapy.

## 2020-12-14 NOTE — ED Provider Notes (Signed)
[facility] EMERGENCY DEPARTMENT Provider Note   CSN: [id number] Arrival date & time: 12/14/20  5009     History Chief Complaint  Patient presents with   Ankle Pain    Adam Mcneil is a 50 y.o. male.  HPI 50 year old male presents with left Achilles pain.  Started acutely yesterday morning when he awoke.  No trauma noted.  No fevers, chills or swelling.  He has a hard time moving his toes and any type of bending his ankle seems to worsen, especially plantar.  Took some Tylenol.  Pain is rated as an 8.  Past Medical History:  Diagnosis Date   Asthma    Migraine     Patient Active Problem List   Diagnosis Date Noted   Strain of calf muscle 10/29/2020   Left hip pain 01/30/2020   Quadriceps tendinitis 02/26/2019   Patellar tendinitis of both knees 02/26/2019   CKD (chronic kidney disease), stage II 09/13/2018   Acute pancreatitis    Acute abdominal pain in right upper quadrant 09/12/2018   OA (osteoarthritis) of knee 01/23/2017    History reviewed. No pertinent surgical history.     History reviewed. No pertinent family history.  Social History   Tobacco Use   Smoking status: Never   Smokeless tobacco: Never  Vaping Use   Vaping Use: Never used  Substance Use Topics   Alcohol use: Yes    Comment: occassional   Drug use: No    Home Medications Prior to Admission medications   Medication Sig Start Date End Date Taking? Authorizing Provider  colchicine 0.6 MG tablet Take 1 tablet (0.6 mg total) by mouth 2 (two) times daily. 02/26/19   Myra Rude, MD  indomethacin (INDOCIN) 50 MG capsule Take 1 capsule (50 mg total) by mouth 2 (two) times daily with a meal. 02/12/20   Myra Rude, MD  predniSONE (DELTASONE) 5 MG tablet Take 6 pills for first day, 5 pills second day, 4 pills third day, 3 pills fourth day, 2 pills the fifth day, and 1 pill sixth day. 01/06/20   Myra Rude, MD    Allergies    Patient has no known  allergies.  Review of Systems   Review of Systems  Constitutional:  Negative for fever.  Musculoskeletal:  Positive for arthralgias. Negative for joint swelling.   Physical Exam Updated Vital Signs BP 106/71   Pulse 67   Temp 98.2 F (36.8 C) (Oral)   Resp 18   Ht 5\' 8"  (1.727 m)   Wt 120.2 kg   SpO2 97%   BMI 40.29 kg/m   Physical Exam Vitals and nursing note reviewed.  Constitutional:      Appearance: He is well-developed.  HENT:     Head: Normocephalic and atraumatic.     Right Ear: External ear normal.     Left Ear: External ear normal.     Nose: Nose normal.  Eyes:     General:        Right eye: No discharge.        Left eye: No discharge.  Cardiovascular:     Rate and Rhythm: Normal rate and regular rhythm.     Pulses:          Dorsalis pedis pulses are 2+ on the left side.  Pulmonary:     Effort: Pulmonary effort is normal.  Abdominal:     General: There is no distension.  Musculoskeletal:     Left ankle: No  swelling. No tenderness.     Left Achilles Tendon: Tenderness present. No defects. Thompson's test negative.     Left foot: No swelling or tenderness.     Comments: No obvious redness over the Achilles tendon.  It is not swollen.  Skin:    General: Skin is warm and dry.  Neurological:     Mental Status: He is alert.  Psychiatric:        Mood and Affect: Mood is not anxious.    ED Results / Procedures / Treatments   Labs (all labs ordered are listed, but only abnormal results are displayed) Labs Reviewed - No data to display  EKG None  Radiology DG Ankle Complete Left  Result Date: 12/14/2020 CLINICAL DATA:  50 year old male with left ankle pain. No known injury. EXAM: LEFT ANKLE COMPLETE - 3+ VIEW COMPARISON:  None. FINDINGS: Bone mineralization is within normal limits. Preserved mortise joint alignment. Talar dome intact. Mild degenerative spurring at the medial malleolus. Similar mild degenerative spurring at the anterior tibial plafond.  Distal tibia, fibula, and calcaneus appear intact. There is mild to moderate dorsal and plantar calcaneus degenerative spurring. Questionable small joint effusion versus anterior soft tissue swelling. Other visible bones of the left foot appear intact. IMPRESSION: Mild degenerative changes at the ankle. Questionable small joint effusion versus anterior soft tissue swelling. No acute osseous abnormality identified. Electronically Signed   By: Odessa Fleming M.D.   On: 12/14/2020 06:54    Procedures Procedures   Medications Ordered in ED Medications  ibuprofen (ADVIL) tablet 600 mg (has no administration in time range)    ED Course  I have reviewed the triage vital signs and the nursing notes.  Pertinent labs & imaging results that were available during my care of the patient were reviewed by me and considered in my medical decision making (see chart for details).    MDM Rules/Calculators/A&P                           The Achilles tendon is intact.  However it is point tender and so this is probably Achilles tendinitis.  Will place in a cam boot and recommend NSAIDs and follow-up with sports medicine.  At this point there is no obvious infection.  No recent medication changes/antibiotic use. Final Clinical Impression(s) / ED Diagnoses Final diagnoses:  Achilles tendinitis of left lower extremity    Rx / DC Orders ED Discharge Orders     None        Pricilla Loveless, MD 12/14/20 0730

## 2020-12-14 NOTE — Patient Instructions (Signed)
Good to see you Please use ice  Please try the exercises   Please send me a message in MyChart with any questions or updates.  Please see me back in 4 weeks.   --Dr. Mikey Maffett  

## 2020-12-15 DIAGNOSIS — M25472 Effusion, left ankle: Secondary | ICD-10-CM | POA: Insufficient documentation

## 2020-12-15 NOTE — Assessment & Plan Note (Signed)
Symptoms seem more consistent with effusion noted at the ankle joint.  No changes acutely occurring of the Achilles tendon itself. -Counseled on home exercise therapy and supportive care. -Injection today. -Could consider physical therapy.

## 2021-03-22 IMAGING — DX DG LUMBAR SPINE 2-3V
3 series · 3 of 3 positions shown · non-contrast
Comparison: CT abdomen pelvis dated 09/12/2018.

CLINICAL DATA: 49-year-old male with left hip pain.

EXAM:
LUMBAR SPINE - 2-3 VIEW

[l-spine ap]
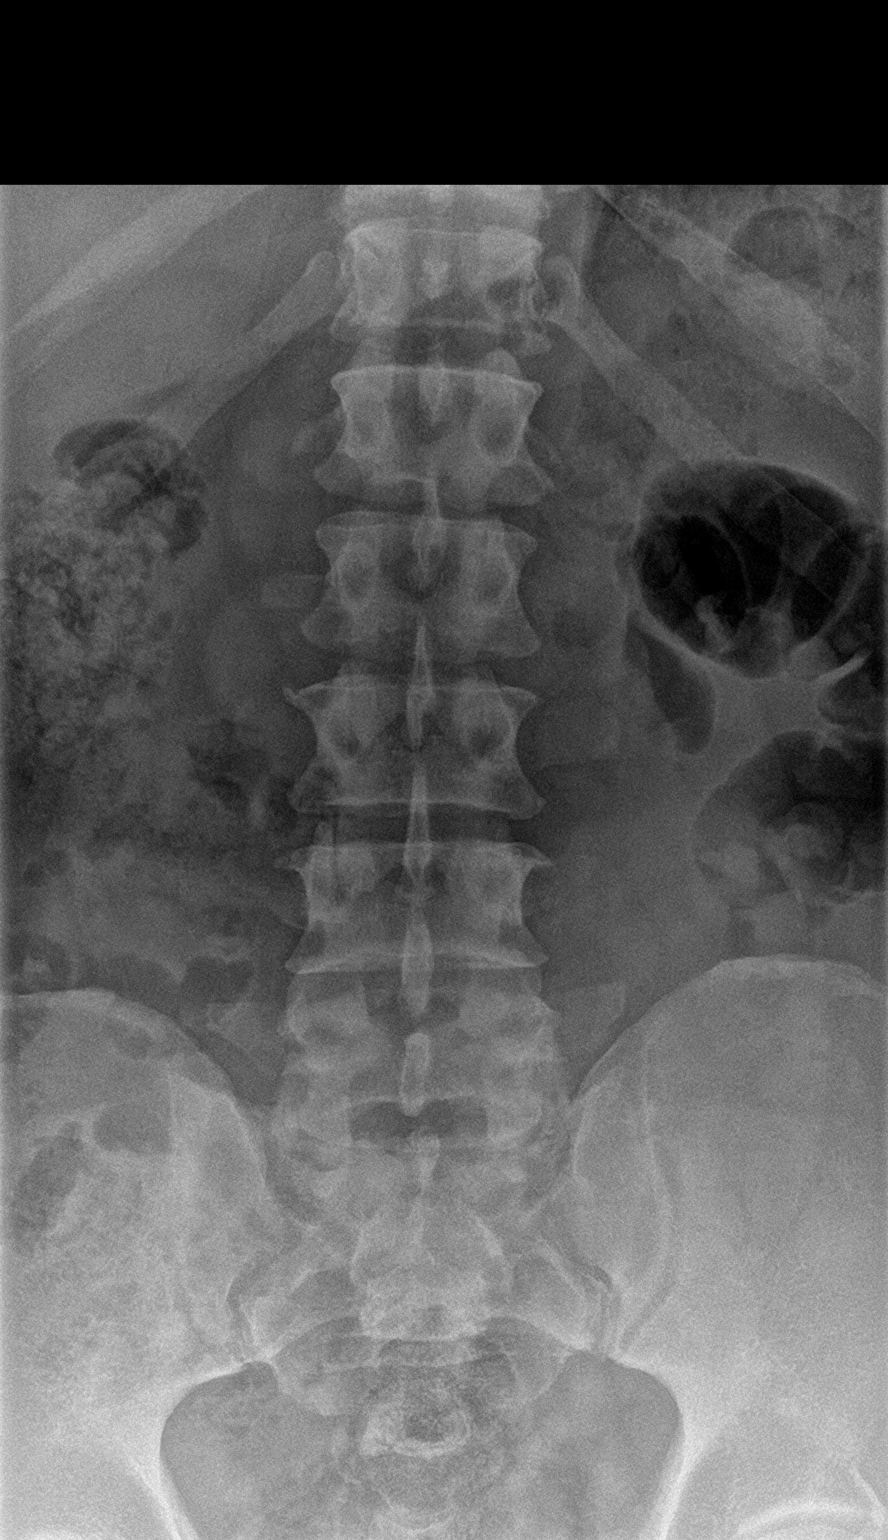

[l-spine lat]
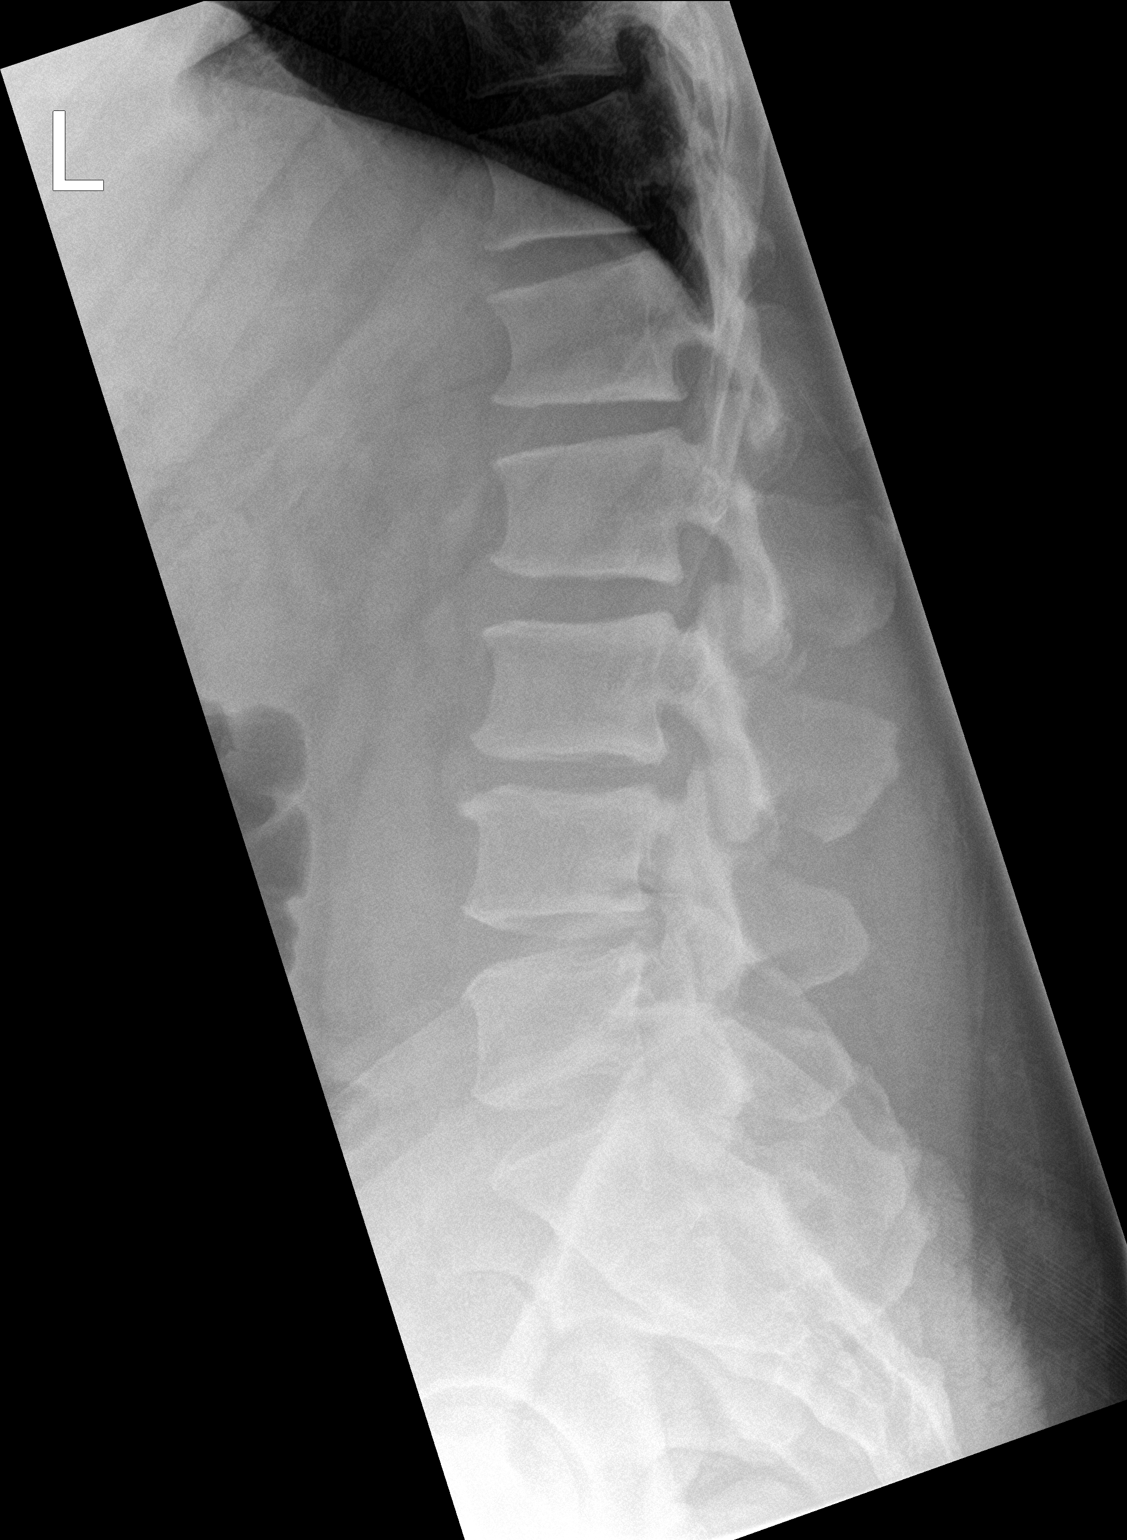

[l-spine spot]
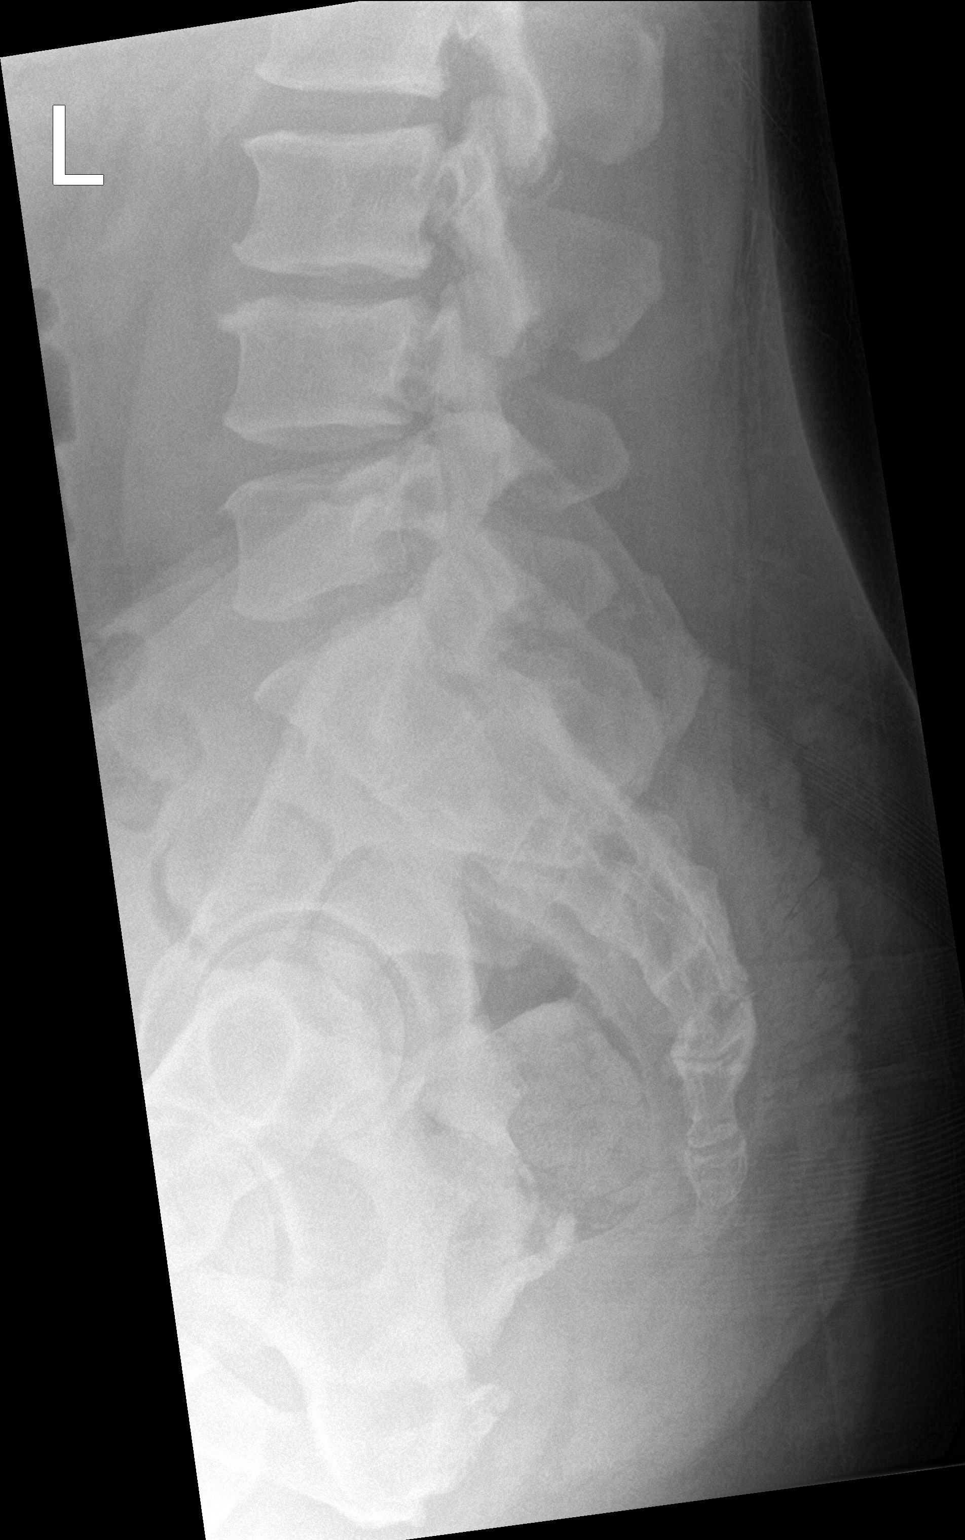

[3 of 3 positions shown; findings below may reference images not displayed]

FINDINGS: Five lumbar type vertebra. There is no acute fracture or subluxation
of the lumbar spine. Mild multilevel chronic compression deformities
with mild anterior wedging primarily at L1 and L2. Mild multilevel
disc space narrowing and spurring. The visualized posterior elements
are intact. The soft tissues are unremarkable.
IMPRESSION: 1. No acute fracture or subluxation.
2. Mild multilevel chronic compression deformities.

## 2021-05-12 ENCOUNTER — Ambulatory Visit: Payer: BC Managed Care – PPO | Admitting: Family Medicine

## 2021-06-30 ENCOUNTER — Ambulatory Visit: Payer: Self-pay

## 2021-06-30 ENCOUNTER — Ambulatory Visit (HOSPITAL_BASED_OUTPATIENT_CLINIC_OR_DEPARTMENT_OTHER)
Admission: RE | Admit: 2021-06-30 | Discharge: 2021-06-30 | Disposition: A | Discharge status: Home / Self Care | Payer: BC Managed Care – PPO | Source: Ambulatory Visit | Attending: Family Medicine | Admitting: Family Medicine

## 2021-06-30 ENCOUNTER — Other Ambulatory Visit (HOSPITAL_BASED_OUTPATIENT_CLINIC_OR_DEPARTMENT_OTHER): Payer: Self-pay

## 2021-06-30 ENCOUNTER — Ambulatory Visit (INDEPENDENT_AMBULATORY_CARE_PROVIDER_SITE_OTHER): Payer: BC Managed Care – PPO | Admitting: Family Medicine

## 2021-06-30 ENCOUNTER — Encounter: Payer: Self-pay | Admitting: Family Medicine

## 2021-06-30 VITALS — BP 138/90 | Ht 68.0 in | Wt 265.0 lb

## 2021-06-30 DIAGNOSIS — M25332 Other instability, left wrist: Secondary | ICD-10-CM | POA: Diagnosis present

## 2021-06-30 DIAGNOSIS — M19039 Primary osteoarthritis, unspecified wrist: Secondary | ICD-10-CM | POA: Diagnosis present

## 2021-06-30 DIAGNOSIS — M25532 Pain in left wrist: Secondary | ICD-10-CM

## 2021-06-30 MED ORDER — PREDNISONE 5 MG PO TABS
ORAL_TABLET | ORAL | 0 refills | Status: DC
  Filled 2021-06-30: qty 21, 6d supply, fill #0

## 2021-06-30 MED ORDER — HYDROCODONE-ACETAMINOPHEN 5-325 MG PO TABS
1.0000 | ORAL_TABLET | Freq: Three times a day (TID) | ORAL | 0 refills | Status: DC | PRN
  Filled 2021-06-30: qty 10, 4d supply, fill #0

## 2021-06-30 NOTE — Assessment & Plan Note (Signed)
Acutely occurring.  The changes do appear to be chronic with no history of injury.  He does have a suspected history of gout which could have contributed to the dissociation. ?-Counseled on home exercise therapy and supportive care. ?-X-ray. ?-Could consider further imaging. ?

## 2021-06-30 NOTE — Patient Instructions (Signed)
Good to see you ?Please try ice  ?I will call with the results.   ?Please send me a message in MyChart with any questions or updates.  ?Please see me back in 2-3 weeks.  ? ?--Dr. Jordan Likes ? ?

## 2021-06-30 NOTE — Assessment & Plan Note (Signed)
Acutely occurring.  Does appear to have degenerative changes observed around the wrist.   ?-Counseled on home exercise therapy and supportive care. ?-Prednisone. ?-Provided work note. ?-Norco. ?-Could consider injection. ?

## 2021-06-30 NOTE — Progress Notes (Signed)
?  Adam Mcneil - 51 y.o. male MRN [id number]  Date of birth: 06/26/1970 ? ?SUBJECTIVE:  Including CC & ROS.  ?No chief complaint on file. ? ? ?Adam Mcneil is a 51 y.o. male that is presenting with acute left wrist pain.  He has pain with supination and pronation of the wrist.  Pain has been ongoing for several weeks.  Denies any specific injury.  No history of similar pain.  He does have a stinging sensation that radiates proximally up his arm.  No pain in his neck.  No history of surgery.  Pain is severe. ? ? ?Review of Systems ?See HPI  ? ?HISTORY: Past Medical, Surgical, Social, and Family History Reviewed & Updated per EMR.   ?Pertinent Historical Findings include: ? ?Past Medical History:  ?Diagnosis Date  ? Asthma   ? Migraine   ? ? ?History reviewed. No pertinent surgical history. ? ? ?PHYSICAL EXAM:  ?VS: BP 138/90 (BP Location: Left Arm, Patient Position: Sitting)   Ht 5\' 8"  (1.727 m)   Wt 265 lb (120.2 kg)   BMI 40.29 kg/m?  ?Physical Exam ?Gen: NAD, alert, cooperative with exam, well-appearing ?MSK:  ?Neurovascularly intact   ? ?Limited ultrasound: Left wrist: ? ?There is widening between the scaphoid and lunate when compared to the contralateral side. ?There does appear to be effusion within the carpal joints themselves. ?No increased hyperemia observed ? ?Summary: There appears to be scapholunate dissociation. ? ?Ultrasound and interpretation by , MD ? ? ? ?ASSESSMENT & PLAN:  ? ?Wrist arthropathy ?Acutely occurring.  Does appear to have degenerative changes observed around the wrist.   ?-Counseled on home exercise therapy and supportive care. ?-Prednisone. ?-Provided work note. ?-Norco. ?-Could consider injection. ? ?Scapho-lunate dissociation, left ?Acutely occurring.  The changes do appear to be chronic with no history of injury.  He does have a suspected history of gout which could have contributed to the dissociation. ?-Counseled on home exercise therapy and supportive  care. ?-X-ray. ?-Could consider further imaging. ? ? ? ? ?

## 2021-07-05 ENCOUNTER — Telehealth: Payer: Self-pay | Admitting: Family Medicine

## 2021-07-05 NOTE — Telephone Encounter (Signed)
Unable to leave VM for patient. 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 We may have to consider an MRI if his pain is ongoing.  ? ?If any questions then please take the best time and phone number to call and I will try to call him back.  ? ?Rosemarie Ax, MD ?[facility] ?07/05/2021, 5:14 PM ? ? ?

## 2021-07-11 NOTE — Telephone Encounter (Signed)
Called pt- no answer/unable to leave vm. Will try again later.  

## 2021-07-11 NOTE — Telephone Encounter (Signed)
Patient called req'd Results & to say he is still in pain . ? ?--Forwarding message to med asst. ? ?--glh ?

## 2021-07-14 ENCOUNTER — Ambulatory Visit: Payer: BC Managed Care – PPO | Admitting: Family Medicine

## 2021-07-18 NOTE — Telephone Encounter (Signed)
Called pt again- no answer/unable to leave vm.  

## 2021-07-21 ENCOUNTER — Ambulatory Visit: Payer: BC Managed Care – PPO | Admitting: Family Medicine

## 2021-07-28 ENCOUNTER — Ambulatory Visit: Payer: BC Managed Care – PPO | Admitting: Family Medicine

## 2021-10-25 ENCOUNTER — Ambulatory Visit (HOSPITAL_BASED_OUTPATIENT_CLINIC_OR_DEPARTMENT_OTHER)
Admission: RE | Admit: 2021-10-25 | Discharge: 2021-10-25 | Disposition: A | Discharge status: Home / Self Care | Payer: BC Managed Care – PPO | Source: Ambulatory Visit | Attending: Family Medicine | Admitting: Family Medicine

## 2021-10-25 ENCOUNTER — Ambulatory Visit (INDEPENDENT_AMBULATORY_CARE_PROVIDER_SITE_OTHER): Payer: BC Managed Care – PPO | Admitting: Family Medicine

## 2021-10-25 ENCOUNTER — Ambulatory Visit: Payer: Self-pay

## 2021-10-25 ENCOUNTER — Encounter: Payer: Self-pay | Admitting: Family Medicine

## 2021-10-25 VITALS — BP 130/92 | Ht 68.0 in | Wt 265.0 lb

## 2021-10-25 DIAGNOSIS — M1712 Unilateral primary osteoarthritis, left knee: Secondary | ICD-10-CM

## 2021-10-25 DIAGNOSIS — M5416 Radiculopathy, lumbar region: Secondary | ICD-10-CM | POA: Insufficient documentation

## 2021-10-25 DIAGNOSIS — M1A0320 Idiopathic chronic gout, left wrist, without tophus (tophi): Secondary | ICD-10-CM | POA: Diagnosis not present

## 2021-10-25 MED ORDER — COLCHICINE 0.6 MG PO TABS: 0.6000 mg | ORAL_TABLET | Freq: Two times a day (BID) | ORAL | 1 refills | Status: AC | PRN

## 2021-10-25 MED ORDER — TRIAMCINOLONE ACETONIDE 40 MG/ML IJ SUSP
40.0000 mg | Freq: Once | INTRAMUSCULAR | Status: AC
  Administered 2021-10-25: 40 mg via INTRA_ARTICULAR

## 2021-10-25 MED ORDER — ALLOPURINOL 100 MG PO TABS: 100.0000 mg | ORAL_TABLET | Freq: Every day | ORAL | 1 refills | Status: AC

## 2021-10-25 MED ORDER — METHYLPREDNISOLONE ACETATE 40 MG/ML IJ SUSP: 40.0000 mg | Freq: Once | INTRAMUSCULAR | Status: DC

## 2021-10-25 NOTE — Assessment & Plan Note (Signed)
Acute on chronic in nature.  Previous degenerative changes were expressed in the MRI. -Counseled on home exercise therapy and supportive care. -X-ray. -Injection today.

## 2021-10-25 NOTE — Patient Instructions (Signed)
Good to see you Please use ice as needed  I will call with the lab results and xray results.  Please use the allpurinol   Please send me a message in MyChart with any questions or updates.  Please see me back in 4 weeks.   --Dr. Jordan Likes

## 2021-10-25 NOTE — Progress Notes (Signed)
  Adam Mcneil - 51 y.o. male MRN [id number]  Date of birth: 1970-04-03  SUBJECTIVE:  Including CC & ROS.  No chief complaint on file.   Adam Mcneil is a 51 y.o. male that is presenting with left-sided leg pain as well as left knee pain.  The pain is acutely occurring over the past few weeks.  He has had previous left knee pain that was similar nature.   Review of Systems See HPI   HISTORY: Past Medical, Surgical, Social, and Family History Reviewed & Updated per EMR.   Pertinent Historical Findings include:  Past Medical History:  Diagnosis Date   Asthma    Migraine     History reviewed. No pertinent surgical history.   PHYSICAL EXAM:  VS: BP (!) 130/92 (BP Location: Left Arm, Patient Position: Sitting)   Ht 5\' 8"  (1.727 m)   Wt 265 lb (120.2 kg)   BMI 40.29 kg/m  Physical Exam Gen: NAD, alert, cooperative with exam, well-appearing MSK:  Neurovascularly intact     Aspiration/Injection Procedure Note Adam Mcneil 1971-01-08  Procedure: Injection Indications: Left knee pain  Procedure Details Consent: Risks of procedure as well as the alternatives and risks of each were explained to the (patient/caregiver).  Consent for procedure obtained. Time Out: Verified patient identification, verified procedure, site/side was marked, verified correct patient position, special equipment/implants available, medications/allergies/relevent history reviewed, required imaging and test results available.  Performed.  The area was cleaned with iodine and alcohol swabs.    The left knee superior lateral suprapatellar pouch was injected using 1 cc's of 40 mg Kenalog and 4 cc's of 0.25% bupivacaine with a 25 1 1/2" needle.  Ultrasound was used. Images were obtained in long views showing the injection.     A sterile dressing was applied.  Patient did tolerate procedure well.     ASSESSMENT & PLAN:   OA (osteoarthritis) of knee Acute on chronic in nature.  Previous  degenerative changes were expressed in the MRI. -Counseled on home exercise therapy and supportive care. -X-ray. -Injection today.   Lumbar radiculopathy Acutely occurring.  Having left-sided pain in the left lateral leg as well as low back. -Counseled on home exercise therapy and supportive care. -Could consider physical therapy or further imaging.  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  Not currently taking any medications related to his gout. -Counseled on home exercise therapy and supportive care. -Colchicine. -Allopurinol. -Check uric acid.

## 2021-10-25 NOTE — Assessment & Plan Note (Signed)
Acute on chronic in nature.  Not currently taking any medications related to his gout. -Counseled on home exercise therapy and supportive care. -Colchicine. -Allopurinol. -Check uric acid.

## 2021-10-25 NOTE — Assessment & Plan Note (Signed)
Acutely occurring.  Having left-sided pain in the left lateral leg as well as low back. -Counseled on home exercise therapy and supportive care. -Could consider physical therapy or further imaging.

## 2021-10-31 ENCOUNTER — Telehealth: Payer: Self-pay | Admitting: Family Medicine

## 2021-10-31 NOTE — Telephone Encounter (Signed)
Unable to leave VM for patient. If he calls back please have him speak with a nurse/CMA and inform that his xrays are normal.   If any questions then please take the best time and phone number to call and I will try to call him back.   Myra Rude, MD [facility] 10/31/2021, 10:25 AM

## 2021-11-03 NOTE — Telephone Encounter (Signed)
Called pt for clarification on below. No answer/unable to leave vm.

## 2021-11-03 NOTE — Telephone Encounter (Signed)
-----   Message from Myra Rude, MD sent at 11/02/2021  2:54 PM EDT ----- Regarding: FW: Pt request extending Out of Wk till next appt 11/22/21 I placed short term disability until 9/15. I don't know if he needs another note. 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 -- patient ask if you can write him out till his next OV  11/22/21.  THXs glh

## 2021-11-04 LAB — COMPREHENSIVE METABOLIC PANEL
ALT: 24 IU/L (ref 0–44)
AST: 20 IU/L (ref 0–40)
Albumin/Globulin Ratio: 1.7 (ref 1.2–2.2)
Albumin: 4.4 g/dL (ref 3.8–4.9)
Alkaline Phosphatase: 97 IU/L (ref 44–121)
BUN/Creatinine Ratio: 6 — ABNORMAL LOW (ref 9–20)
BUN: 9 mg/dL (ref 6–24)
Bilirubin Total: 0.4 mg/dL (ref 0.0–1.2)
CO2: 21 mmol/L (ref 20–29)
Calcium: 11.3 mg/dL — ABNORMAL HIGH (ref 8.7–10.2)
Chloride: 102 mmol/L (ref 96–106)
Creatinine, Ser: 1.5 mg/dL — ABNORMAL HIGH (ref 0.76–1.27)
Globulin, Total: 2.6 g/dL (ref 1.5–4.5)
Glucose: 113 mg/dL — ABNORMAL HIGH (ref 70–99)
Potassium: 4.4 mmol/L (ref 3.5–5.2)
Sodium: 140 mmol/L (ref 134–144)
Total Protein: 7 g/dL (ref 6.0–8.5)
eGFR: 55 mL/min/{1.73_m2} — ABNORMAL LOW (ref >59)

## 2021-11-04 LAB — URIC ACID: Uric Acid: 6.9 mg/dL (ref 3.8–8.4)

## 2021-11-04 NOTE — Telephone Encounter (Signed)
Pt informed of below.   Letter updated, printed and signed. Upfront for p/u.

## 2021-11-09 ENCOUNTER — Telehealth: Payer: Self-pay | Admitting: Family Medicine

## 2021-11-09 DIAGNOSIS — N1831 Chronic kidney disease, stage 3a: Secondary | ICD-10-CM

## 2021-11-09 NOTE — Telephone Encounter (Signed)
Left VM for patient. If he calls back please have him speak with a nurse/CMA and inform that his kidney function is decreased.  I am placing a referral to nephrology.   If any questions then please take the best time and phone number to call and I will try to call him back.   Myra Rude, MD [facility] 11/09/2021, 8:12 AM

## 2021-11-10 NOTE — Telephone Encounter (Signed)
Pt informed of below.  Referral faxed to [phone].

## 2021-11-22 ENCOUNTER — Encounter: Payer: Self-pay | Admitting: Family Medicine

## 2021-11-22 ENCOUNTER — Ambulatory Visit (INDEPENDENT_AMBULATORY_CARE_PROVIDER_SITE_OTHER): Payer: BC Managed Care – PPO | Admitting: Family Medicine

## 2021-11-22 VITALS — BP 140/100 | Ht 68.0 in | Wt 265.0 lb

## 2021-11-22 DIAGNOSIS — M1712 Unilateral primary osteoarthritis, left knee: Secondary | ICD-10-CM

## 2021-11-22 MED ORDER — HYDROCODONE-ACETAMINOPHEN 5-325 MG PO TABS: 1.0000 | ORAL_TABLET | Freq: Three times a day (TID) | ORAL | 0 refills | Status: DC | PRN

## 2021-11-22 NOTE — Patient Instructions (Signed)
Good to see you Please use ice as needed. Please use the pain medicine as needed. We will obtain the MRI from the med center in [place]. Please send me a message in MyChart with any questions or updates.  We will set up a virtual visit once the MRI is resulted..   --Dr. Jordan Likes

## 2021-11-22 NOTE — Progress Notes (Signed)
  Adam Mcneil - 51 y.o. male MRN [id number]  Date of birth: May 11, 1970  SUBJECTIVE:  Including CC & ROS.  No chief complaint on file.   Adam Mcneil is a 51 y.o. male that is presenting with acute worsening of his left knee pain.  His knee pain is acute on chronic in nature.  He has tried steroid injections in the past as well as medications.  The pain is severe in nature and affects him with walking and going up and down stairs.   Review of Systems See HPI   HISTORY: Past Medical, Surgical, Social, and Family History Reviewed & Updated per EMR.   Pertinent Historical Findings include:  Past Medical History:  Diagnosis Date   Asthma    Migraine     History reviewed. No pertinent surgical history.   PHYSICAL EXAM:  VS: BP (!) 140/100 (BP Location: Left Arm, Patient Position: Sitting)   Ht 5\' 8"  (1.727 m)   Wt 265 lb (120.2 kg)   BMI 40.29 kg/m  Physical Exam Gen: NAD, alert, cooperative with exam, well-appearing MSK:  Left knee: Limited extension and flexion. Instability to valgus and varus stress testing. Positive McMurray's test Neurovascularly intact       ASSESSMENT & PLAN:   OA (osteoarthritis) of knee Acute on chronic in nature.  Having severe pain despite conservative measures.  We have tried medications, injections and home exercise therapy.  He has been under the physician care home exercise therapy for greater than 6 weeks.  X-rays have been unrevealing. -Counseled on home exercise therapy and supportive care. -norco. -MRI of the left knee to evaluate for loose bodies, insufficiency fracture or worsening arthritis and for presurgical planning.

## 2021-11-22 NOTE — Progress Notes (Unsigned)
To Whom it may Concern :   It is my opinion that Adam Mcneil should remain out of work until 12/05/2021. Please contact my office if there are any questions or concerns.            Sincerely,     Myra Rude

## 2021-11-22 NOTE — Assessment & Plan Note (Signed)
Acute on chronic in nature.  Having severe pain despite conservative measures.  We have tried medications, injections and home exercise therapy.  He has been under the physician care home exercise therapy for greater than 6 weeks.  X-rays have been unrevealing. -Counseled on home exercise therapy and supportive care. -norco. -MRI of the left knee to evaluate for loose bodies, insufficiency fracture or worsening arthritis and for presurgical planning.

## 2021-11-29 ENCOUNTER — Ambulatory Visit (INDEPENDENT_AMBULATORY_CARE_PROVIDER_SITE_OTHER): Payer: BC Managed Care – PPO

## 2021-11-29 DIAGNOSIS — M1712 Unilateral primary osteoarthritis, left knee: Secondary | ICD-10-CM

## 2021-12-01 ENCOUNTER — Telehealth (INDEPENDENT_AMBULATORY_CARE_PROVIDER_SITE_OTHER): Payer: BC Managed Care – PPO | Admitting: Family Medicine

## 2021-12-01 DIAGNOSIS — M1712 Unilateral primary osteoarthritis, left knee: Secondary | ICD-10-CM | POA: Diagnosis not present

## 2021-12-01 NOTE — Assessment & Plan Note (Signed)
MRI was showing a more anterior origins to his knee pain.  He does demonstrate degenerative changes but it appears to be more in the trochlear groove. -Counseled on home exercise therapy and supportive care. -pursue gel injections

## 2021-12-01 NOTE — Progress Notes (Signed)
Virtual Visit via Video Note  I connected with Adam Mcneil on 12/01/21 at  1:10 PM EDT by a video enabled telemedicine application and verified that I am speaking with the correct person using two identifiers.  Location: Patient: home Provider: office   I discussed the limitations of evaluation and management by telemedicine and the availability of in person appointments. The patient expressed understanding and agreed to proceed.  History of Present Illness:  Adam Mcneil is a 50 year old male that is following up after the MRI of his left knee.  This is showing progressive trochlear chondromalacia and chronic inflammation edema of the prefemoral fat pad.   Observations/Objective:   Assessment and Plan:  Osteoarthritis of the left knee: MRI was showing a more anterior origins to his knee pain.  He does demonstrate degenerative changes but it appears to be more in the trochlear groove. -Counseled on home exercise therapy and supportive care. -pursue gel injections  Follow Up Instructions:    I discussed the assessment and treatment plan with the patient. The patient was provided an opportunity to ask questions and all were answered. The patient agreed with the plan and demonstrated an understanding of the instructions.   The patient was advised to call back or seek an in-person evaluation if the symptoms worsen or if the condition fails to improve as anticipated.    Clare Gandy, MD

## 2021-12-03 ENCOUNTER — Telehealth: Payer: Self-pay | Admitting: Family Medicine

## 2021-12-03 NOTE — Telephone Encounter (Signed)
Requested benefits request for ortho/monovisc today. Uploaded last office visit notes.

## 2021-12-06 NOTE — Telephone Encounter (Signed)
Received fax from [facility] stating Milinda Cave and Durolane are preferred for this patient's plan. Denny Peon, can you verify benefits for either of these gel shots?

## 2021-12-12 NOTE — Telephone Encounter (Signed)
Submitted request for durolane and uploaded the office note.

## 2021-12-12 NOTE — Telephone Encounter (Signed)
Patient provided a updated note for work until we get his gel shot approved. Note upfront for p/u. Pt informed

## 2021-12-13 NOTE — Telephone Encounter (Signed)
Pt informed of below.   [id number] benefit summary states Durolane [id number] and [id number] are covered at 90%. Patient has a $40 copay. If OOP is met, coverage goes to 100% and no copay applies.  OV scheduled 12/16/21. Durolane inj ordered with rep.

## 2021-12-16 ENCOUNTER — Encounter: Payer: Self-pay | Admitting: Family Medicine

## 2021-12-16 ENCOUNTER — Ambulatory Visit (INDEPENDENT_AMBULATORY_CARE_PROVIDER_SITE_OTHER): Payer: BC Managed Care – PPO | Admitting: Family Medicine

## 2021-12-16 ENCOUNTER — Ambulatory Visit: Payer: Self-pay

## 2021-12-16 VITALS — BP 135/83 | Ht 68.0 in | Wt 265.0 lb

## 2021-12-16 DIAGNOSIS — M1712 Unilateral primary osteoarthritis, left knee: Secondary | ICD-10-CM

## 2021-12-16 MED ORDER — SODIUM HYALURONATE 60 MG/3ML IX PRSY
60.0000 mg | PREFILLED_SYRINGE | Freq: Once | INTRA_ARTICULAR | Status: AC
  Administered 2021-12-16: 60 mg via INTRA_ARTICULAR

## 2021-12-16 NOTE — Progress Notes (Signed)
  Adam Mcneil - 51 y.o. male MRN [id number]  Date of birth: Nov 01, 1970  SUBJECTIVE:  Including CC & ROS.  No chief complaint on file.   Adam Mcneil is a 51 y.o. male that is here for a gel injection.    Review of Systems See HPI   HISTORY: Past Medical, Surgical, Social, and Family History Reviewed & Updated per EMR.   Pertinent Historical Findings include:  Past Medical History:  Diagnosis Date   Asthma    Migraine     History reviewed. No pertinent surgical history.   PHYSICAL EXAM:  VS: BP 135/83 (BP Location: Left Arm, Patient Position: Sitting)   Ht 5\' 8"  (1.727 m)   Wt 265 lb (120.2 kg)   BMI 40.29 kg/m  Physical Exam Gen: NAD, alert, cooperative with exam, well-appearing MSK:  Neurovascularly intact     Aspiration/Injection Procedure Note Adam Mcneil [birth year]  Procedure: Injection Indications: left knee pain  Procedure Details Consent: Risks of procedure as well as the alternatives and risks of each were explained to the (patient/caregiver).  Consent for procedure obtained. Time Out: Verified patient identification, verified procedure, site/side was marked, verified correct patient position, special equipment/implants available, medications/allergies/relevent history reviewed, required imaging and test results available.  Performed.  The area was cleaned with iodine and alcohol swabs.    The left knee superior lateral suprapatellar pouch was injected using 4 cc's of 1% lidocaine with a 21 2" needle.  The syringe was switched and a 60 mg/3 mL of durolane was injected. Ultrasound was used. Images were obtained in  Long views showing the injection.    A sterile dressing was applied.  Patient did tolerate procedure well.    ASSESSMENT & PLAN:   OA (osteoarthritis) of knee Completed gel injection.  - could consider nerve block or zilretta.

## 2021-12-16 NOTE — Patient Instructions (Signed)
Good to see you Please use ice   Please send me a message in MyChart with any questions or updates.  Please see me back in 4 weeks.   --Dr. Louvinia Cumbo  

## 2021-12-16 NOTE — Assessment & Plan Note (Signed)
Completed gel injection.  - could consider nerve block or zilretta.

## 2021-12-29 ENCOUNTER — Ambulatory Visit (INDEPENDENT_AMBULATORY_CARE_PROVIDER_SITE_OTHER): Payer: BC Managed Care – PPO | Admitting: Family Medicine

## 2021-12-29 ENCOUNTER — Encounter: Payer: Self-pay | Admitting: Family Medicine

## 2021-12-29 DIAGNOSIS — M1712 Unilateral primary osteoarthritis, left knee: Secondary | ICD-10-CM | POA: Diagnosis not present

## 2021-12-29 NOTE — Assessment & Plan Note (Signed)
Acute on chronic in nature.  Continues to have pain with any weightbearing. -Counseled on home exercise therapy and supportive care. -Provided work note. -Can consider zilretta injection

## 2021-12-29 NOTE — Progress Notes (Signed)
  Adam Mcneil - 51 y.o. male MRN [id number]  Date of birth: 05/28/1970  SUBJECTIVE:  Including CC & ROS.  No chief complaint on file.   Adam Mcneil is a 51 y.o. male that is following up for his left knee pain.  His pain is still severe and unable to complete a full day of work.    Review of Systems See HPI   HISTORY: Past Medical, Surgical, Social, and Family History Reviewed & Updated per EMR.   Pertinent Historical Findings include:  Past Medical History:  Diagnosis Date   Asthma    Migraine     History reviewed. No pertinent surgical history.   PHYSICAL EXAM:  VS: BP 134/86 (BP Location: Right Arm, Patient Position: Sitting)   Ht 5\' 8"  (1.727 m)   Wt 265 lb (120.2 kg)   BMI 40.29 kg/m  Physical Exam Gen: NAD, alert, cooperative with exam, well-appearing MSK:  Neurovascularly intact       ASSESSMENT & PLAN:   OA (osteoarthritis) of knee Acute on chronic in nature.  Continues to have pain with any weightbearing. -Counseled on home exercise therapy and supportive care. -Provided work note. -Can consider zilretta injection

## 2021-12-29 NOTE — Patient Instructions (Signed)
Good to see you Please use ice as needed  Please send me a message in MyChart with any questions or updates.  Please see me back as scheduled.   --Dr. Tiffny Gemmer  

## 2022-01-17 ENCOUNTER — Encounter: Payer: Self-pay | Admitting: Family Medicine

## 2022-01-17 ENCOUNTER — Ambulatory Visit (INDEPENDENT_AMBULATORY_CARE_PROVIDER_SITE_OTHER): Payer: BC Managed Care – PPO | Admitting: Family Medicine

## 2022-01-17 DIAGNOSIS — M1712 Unilateral primary osteoarthritis, left knee: Secondary | ICD-10-CM

## 2022-01-17 NOTE — Patient Instructions (Signed)
Good to see you Please use ice as needed  Please continue the exercises   Please send me a message in MyChart with any questions or updates.  Please see me back as needed.   --Dr. Jostin Rue  

## 2022-01-17 NOTE — Progress Notes (Signed)
  PATE AYLWARD - 51 y.o. male MRN [id number]  Date of birth: 01/14/1971  SUBJECTIVE:  Including CC & ROS.  No chief complaint on file.   IZACK HOOGLAND is a 51 y.o. male that is following up for his left knee pain.  He has noticed improvement of his pain since the injections, therapy and time off.   Review of Systems See HPI   HISTORY: Past Medical, Surgical, Social, and Family History Reviewed & Updated per EMR.   Pertinent Historical Findings include:  Past Medical History:  Diagnosis Date   Asthma    Migraine     History reviewed. No pertinent surgical history.   PHYSICAL EXAM:  VS: BP 110/80 (BP Location: Left Arm, Patient Position: Sitting)   Ht 5\' 8"  (1.727 m)   Wt 265 lb (120.2 kg)   BMI 40.29 kg/m  Physical Exam Gen: NAD, alert, cooperative with exam, well-appearing MSK:  Neurovascularly intact       ASSESSMENT & PLAN:   OA (osteoarthritis) of knee Not currently having pain.  Has done well with modalities to date. -Counseled on home exercise therapy and supportive care. -Completed paperwork. -Could consider Zilretta

## 2022-01-17 NOTE — Assessment & Plan Note (Signed)
Not currently having pain.  Has done well with modalities to date. -Counseled on home exercise therapy and supportive care. -Completed paperwork. -Could consider Zilretta

## 2022-06-05 ENCOUNTER — Ambulatory Visit: Payer: BC Managed Care – PPO | Admitting: Family Medicine

## 2022-06-05 ENCOUNTER — Ambulatory Visit: Payer: Self-pay

## 2022-06-05 VITALS — BP 134/86 | Ht 68.0 in | Wt 270.0 lb

## 2022-06-05 DIAGNOSIS — M6528 Calcific tendinitis, other site: Secondary | ICD-10-CM

## 2022-06-05 DIAGNOSIS — M7660 Achilles tendinitis, unspecified leg: Secondary | ICD-10-CM

## 2022-06-05 MED ORDER — PREDNISONE 20 MG PO TABS: ORAL_TABLET | ORAL | 0 refills | Status: DC

## 2022-06-05 NOTE — Progress Notes (Signed)
  Adam Mcneil - 52 y.o. male MRN [id number]  Date of birth: 06-Feb-1971  SUBJECTIVE:  Including CC & ROS.  No chief complaint on file.   Adam Mcneil is a 52 y.o. male that is presenting with acute left Achilles pain.  The pain is occurring at the Achilles insertion and has tenderness on palpation.  No history of similar pain and no history of surgery.    Review of Systems See HPI   HISTORY: Past Medical, Surgical, Social, and Family History Reviewed & Updated per EMR.   Pertinent Historical Findings include:  Past Medical History:  Diagnosis Date   Asthma    Migraine     No past surgical history on file.   PHYSICAL EXAM:  VS: BP 134/86   Ht 5\' 8"  (1.727 m)   Wt 270 lb (122.5 kg)   BMI 41.05 kg/m  Physical Exam Gen: NAD, alert, cooperative with exam, well-appearing MSK:  Neurovascularly intact    Limited ultrasound: left achilles pain:  No tear of the Achilles appreciated. Retrocalcaneal bursitis was appreciated. A fairly large calcaneal posterior heel spur with hyperemia was appreciated  Summary: Findings consistent with an calcific Achilles tendinitis  Ultrasound and interpretation by Clearance Coots, MD    ASSESSMENT & PLAN:   Calcific Achilles tendonitis Acutely occurring.  Tender on palpation and changes at the insertion with a bursitis appreciated -Counseled on home exercise therapy and supportive care. -Prednisone. -Could consider physical therapy, heel lifts or injection.

## 2022-06-05 NOTE — Patient Instructions (Signed)
Good to see you Please alternate heat and ice  Please try the exercises when the pain has improved   Please send me a message in MyChart with any questions or updates.  Please see me back in 4 weeks.   --Dr. Raeford Razor

## 2022-06-05 NOTE — Assessment & Plan Note (Signed)
Acutely occurring.  Tender on palpation and changes at the insertion with a bursitis appreciated -Counseled on home exercise therapy and supportive care. -Prednisone. -Could consider physical therapy, heel lifts or injection.

## 2022-06-06 ENCOUNTER — Other Ambulatory Visit: Payer: Self-pay | Admitting: *Deleted

## 2022-06-06 MED ORDER — PREDNISONE 20 MG PO TABS: ORAL_TABLET | ORAL | 0 refills | Status: DC

## 2022-06-06 MED ORDER — PREDNISONE 20 MG PO TABS: ORAL_TABLET | ORAL | 0 refills | Status: AC

## 2022-06-06 NOTE — Addendum Note (Signed)
Addended by: Rosemarie Ax on: 06/06/2022 02:54 PM   Modules accepted: Orders

## 2022-07-11 ENCOUNTER — Encounter: Payer: Self-pay | Admitting: *Deleted

## 2022-07-11 ENCOUNTER — Ambulatory Visit: Payer: BC Managed Care – PPO | Admitting: Family Medicine

## 2022-08-21 IMAGING — DX DG WRIST COMPLETE 3+V*L*
4 series · 4 of 4 positions shown · non-contrast
Comparison: None.

CLINICAL DATA: Left wrist pain

EXAM:
LEFT WRIST - COMPLETE 3+ VIEW

[wrist pa]
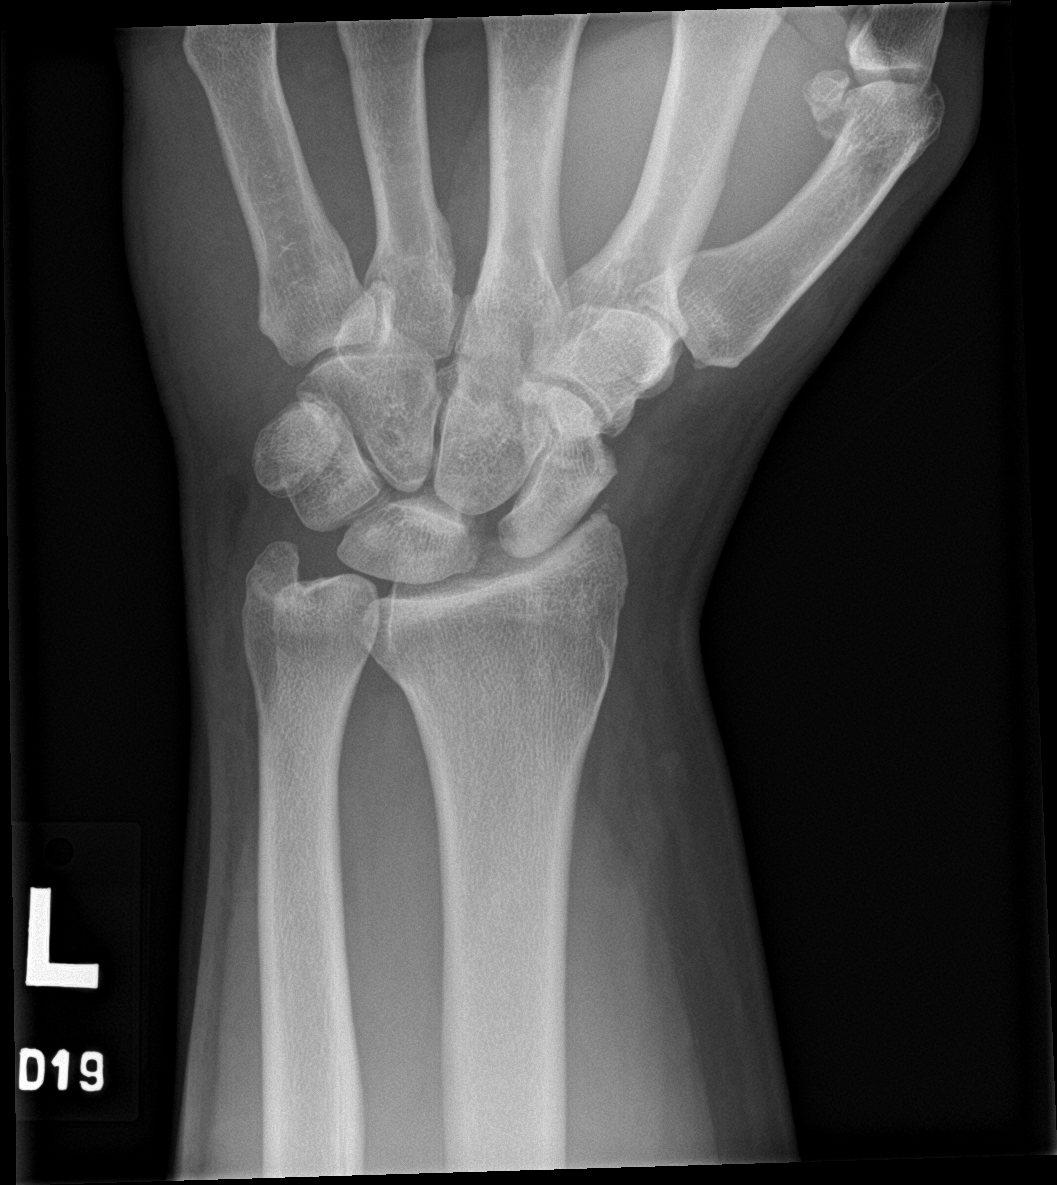

[wrist obl]
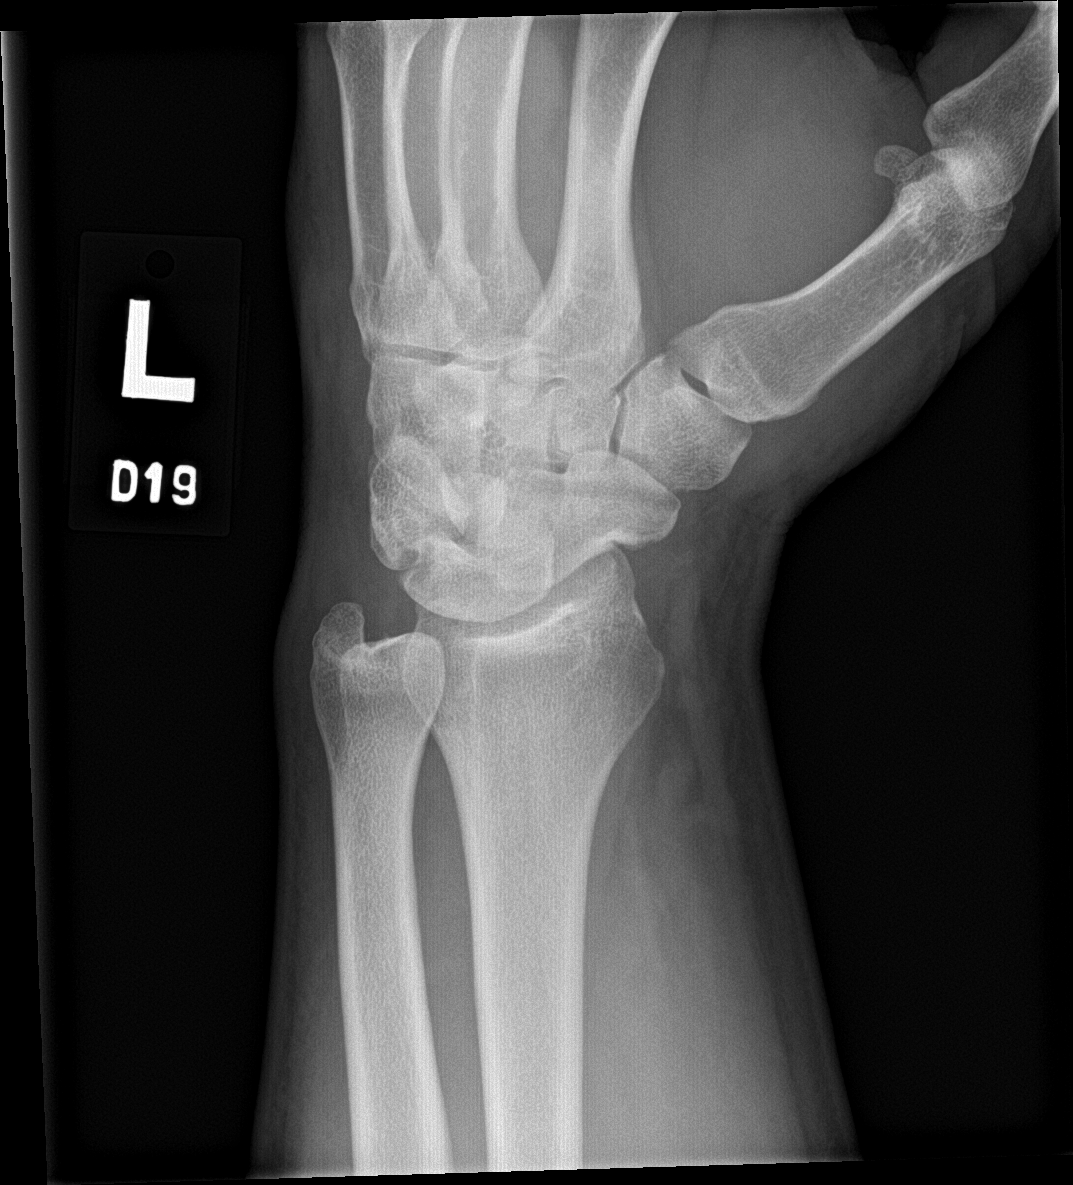

[wrist lat]
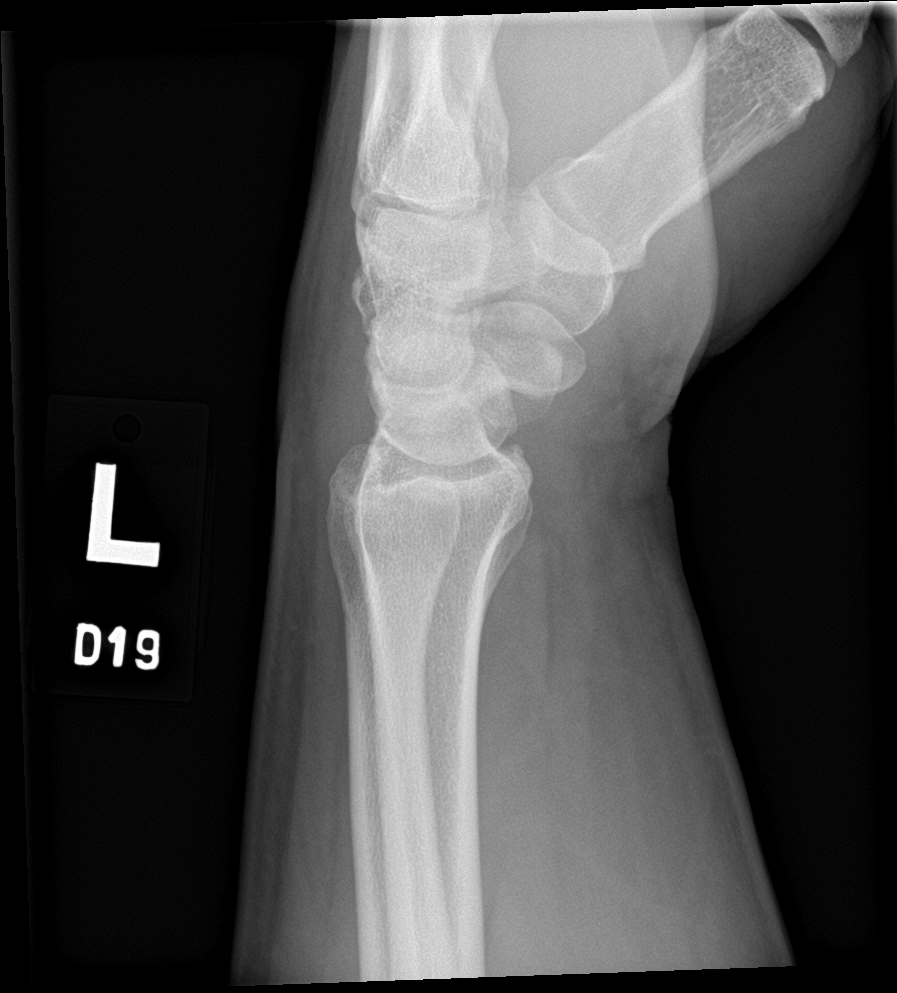

[wrist navicular]
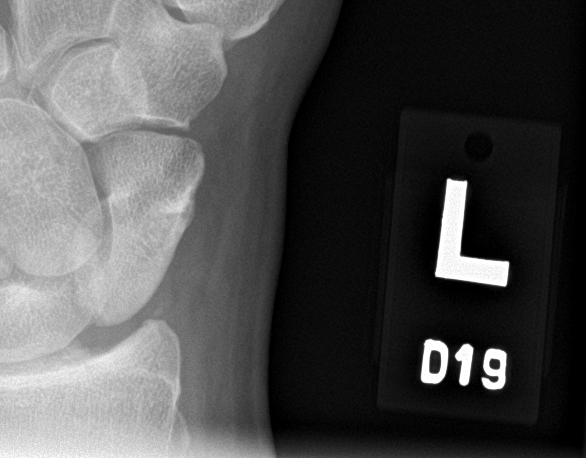

[4 of 4 positions shown; findings below may reference images not displayed]

FINDINGS: There is no evidence of fracture or dislocation. There is no
evidence of arthropathy or other focal bone abnormality. Soft
tissues are unremarkable.
IMPRESSION: Negative.

## 2022-09-22 ENCOUNTER — Other Ambulatory Visit: Payer: Self-pay

## 2022-09-22 ENCOUNTER — Emergency Department (HOSPITAL_BASED_OUTPATIENT_CLINIC_OR_DEPARTMENT_OTHER)
Admission: EM | Admit: 2022-09-22 | Discharge: 2022-09-22 | Disposition: A | Discharge status: Home / Self Care | Payer: BC Managed Care – PPO | Attending: Emergency Medicine | Admitting: Emergency Medicine

## 2022-09-22 ENCOUNTER — Encounter (HOSPITAL_BASED_OUTPATIENT_CLINIC_OR_DEPARTMENT_OTHER): Payer: Self-pay | Admitting: Emergency Medicine

## 2022-09-22 DIAGNOSIS — M10071 Idiopathic gout, right ankle and foot: Secondary | ICD-10-CM | POA: Insufficient documentation

## 2022-09-22 DIAGNOSIS — M79674 Pain in right toe(s): Secondary | ICD-10-CM | POA: Diagnosis present

## 2022-09-22 MED ORDER — KETOROLAC TROMETHAMINE 60 MG/2ML IM SOLN
60.0000 mg | Freq: Once | INTRAMUSCULAR | Status: AC
  Administered 2022-09-22: 60 mg via INTRAMUSCULAR
  Filled 2022-09-22: qty 2

## 2022-09-22 MED ORDER — PREDNISONE 10 MG PO TABS: 60.0000 mg | ORAL_TABLET | Freq: Every day | ORAL | 0 refills | Status: AC

## 2022-09-22 NOTE — ED Triage Notes (Addendum)
Pt presents from home for R great toe pain that started yesterday evening. Tender to very light touch. Denies trauma to foot.   Denies h/o gout. States that this toes was amputated in past from a lawnmower accident and surgically reattached, approximately 30 years ago.   No OTC meds tried

## 2022-09-22 NOTE — ED Provider Notes (Signed)
[place] [facility] AT [facility] Provider Note   CSN: [id number] Arrival date & time: 09/22/22  0550     History  Chief Complaint  Patient presents with   Toe Pain    Adam Mcneil is a 52 y.o. male.  The history is provided by the patient.  Toe Pain  Adam Mcneil is a 52 y.o. male who presents to the Emergency Department complaining of toe pain.  He presents to the emergency department complaining of pain to his right great toe that started last night.  He has associated swelling.  Pain is severe in nature and hurts with movement or touching.  No associated fever, nausea, vomiting, chest pain, shortness of breath.  Does have a history of amputation and reattachment of the distal aspect of his right great toe 35 years previously.  He denies any medical history.  He has experienced gout in the wrist previously but is not currently on medications.  No recent dental procedures, tattoos, piercings.  No history of IV drug abuse.    Home Medications Prior to Admission medications   Medication Sig Start Date End Date Taking? Authorizing Provider  predniSONE (DELTASONE) 10 MG tablet Take 6 tablets (60 mg total) by mouth daily. 09/22/22  Yes Tilden Fossa, MD  allopurinol (ZYLOPRIM) 100 MG tablet Take 1 tablet (100 mg total) by mouth daily. 10/25/21   Myra Rude, MD  colchicine 0.6 MG tablet Take 1 tablet (0.6 mg total) by mouth 2 (two) times daily as needed. 10/25/21   Myra Rude, MD  HYDROcodone-acetaminophen (NORCO/VICODIN) 5-325 MG tablet Take 1 tablet by mouth every 8 (eight) hours as needed. 11/22/21   Myra Rude, MD  predniSONE (DELTASONE) 20 MG tablet TAKE 3 TABS ONCE DAILY FOR 3 DAYS, 2 FOR 3 DAYS, 1 FOR 2 DAYS THEN 1/2 FOR 2 DAYS 06/06/22   Myra Rude, MD      Allergies    Patient has no known allergies.    Review of Systems   Review of Systems  All other systems reviewed and are negative.   Physical Exam Updated  Vital Signs BP (!) 146/85 (BP Location: Right Arm)   Pulse 76   Temp 97.9 F (36.6 C)   Resp 18   Wt 129.3 kg   SpO2 96%   BMI 43.33 kg/m  Physical Exam Vitals and nursing note reviewed.  Constitutional:      Appearance: Normal appearance.  Cardiovascular:     Rate and Rhythm: Normal rate.  Musculoskeletal:     Comments: 2+ DP pulses bilaterally.  There is soft tissue swelling, tenderness over the right first MTP joint with painful range of motion and palpation.  There is no significant erythema extending proximally or distally to this area.  Skin:    General: Skin is warm and dry.     Capillary Refill: Capillary refill takes less than 2 seconds.  Neurological:     Mental Status: He is alert and oriented to person, place, and time.     Comments: Sensation to light touch intact throughout the foot.     ED Results / Procedures / Treatments   Labs (all labs ordered are listed, but only abnormal results are displayed) Labs Reviewed - No data to display  EKG None  Radiology No results found.  Procedures Procedures    Medications Ordered in ED Medications  ketorolac (TORADOL) injection 60 mg (60 mg Intramuscular Given 09/22/22 4098)    ED Course/  Medical Decision Making/ A&P                             Medical Decision Making Risk Prescription drug management.   Patient here for evaluation of right great toe pain.  He has tenderness to palpation, local erythema.  Suspect that he has acute gout.  No reported recent injuries.  He does have a history of gout in the wrist, not currently on medications.  Will treat with Toradol here and start on prednisone with OTC naproxen.  Discussed outpatient follow-up and return precautions.  Current clinical picture is not consistent with septic arthritis, soft tissue infection, fracture, osteomyelitis.        Final Clinical Impression(s) / ED Diagnoses Final diagnoses:  Acute idiopathic gout involving toe of right foot     Rx / DC Orders ED Discharge Orders          Ordered    predniSONE (DELTASONE) 10 MG tablet  Daily        09/22/22 0612              Tilden Fossa, MD 09/22/22 [phone]

## 2022-10-04 ENCOUNTER — Ambulatory Visit: Payer: BC Managed Care – PPO | Admitting: Sports Medicine

## 2022-12-04 ENCOUNTER — Emergency Department (HOSPITAL_BASED_OUTPATIENT_CLINIC_OR_DEPARTMENT_OTHER)
Admission: EM | Admit: 2022-12-04 | Discharge: 2022-12-04 | Disposition: A | Discharge status: Home / Self Care | Payer: BC Managed Care – PPO

## 2022-12-04 ENCOUNTER — Encounter (HOSPITAL_BASED_OUTPATIENT_CLINIC_OR_DEPARTMENT_OTHER): Payer: Self-pay

## 2022-12-04 ENCOUNTER — Emergency Department (HOSPITAL_BASED_OUTPATIENT_CLINIC_OR_DEPARTMENT_OTHER): Payer: BC Managed Care – PPO

## 2022-12-04 DIAGNOSIS — R519 Headache, unspecified: Secondary | ICD-10-CM | POA: Insufficient documentation

## 2022-12-04 DIAGNOSIS — Z7952 Long term (current) use of systemic steroids: Secondary | ICD-10-CM | POA: Insufficient documentation

## 2022-12-04 DIAGNOSIS — R202 Paresthesia of skin: Secondary | ICD-10-CM | POA: Insufficient documentation

## 2022-12-04 DIAGNOSIS — J45909 Unspecified asthma, uncomplicated: Secondary | ICD-10-CM | POA: Diagnosis not present

## 2022-12-04 LAB — BASIC METABOLIC PANEL
Anion gap: 8 (ref 5–15)
BUN: 13 mg/dL (ref 6–20)
CO2: 24 mmol/L (ref 22–32)
Calcium: 10.2 mg/dL (ref 8.9–10.3)
Chloride: 105 mmol/L (ref 98–111)
Creatinine, Ser: 1.41 mg/dL — ABNORMAL HIGH (ref 0.61–1.24)
GFR, Estimated: 59 mL/min — ABNORMAL LOW (ref >60)
Glucose, Bld: 105 mg/dL — ABNORMAL HIGH (ref 70–99)
Potassium: 4.3 mmol/L (ref 3.5–5.1)
Sodium: 137 mmol/L (ref 135–145)

## 2022-12-04 LAB — CBC
HCT: 43.6 % (ref 39.0–52.0)
Hemoglobin: 14.3 g/dL (ref 13.0–17.0)
MCH: 28.4 pg (ref 26.0–34.0)
MCHC: 32.8 g/dL (ref 30.0–36.0)
MCV: 86.7 fL (ref 80.0–100.0)
Platelets: 268 10*3/uL (ref 150–400)
RBC: 5.03 MIL/uL (ref 4.22–5.81)
RDW: 15.1 % (ref 11.5–15.5)
WBC: 4.9 10*3/uL (ref 4.0–10.5)
nRBC: 0 % (ref 0.0–0.2)

## 2022-12-04 MED ORDER — METHOCARBAMOL 500 MG PO TABS
750.0000 mg | ORAL_TABLET | Freq: Once | ORAL | Status: AC
  Administered 2022-12-04: 750 mg via ORAL
  Filled 2022-12-04: qty 2

## 2022-12-04 MED ORDER — PROCHLORPERAZINE EDISYLATE 10 MG/2ML IJ SOLN
10.0000 mg | Freq: Once | INTRAMUSCULAR | Status: AC
  Administered 2022-12-04: 10 mg via INTRAVENOUS
  Filled 2022-12-04: qty 2

## 2022-12-04 MED ORDER — METHOCARBAMOL 500 MG PO TABS: 500.0000 mg | ORAL_TABLET | Freq: Two times a day (BID) | ORAL | 0 refills | Status: AC

## 2022-12-04 MED ORDER — KETOROLAC TROMETHAMINE 15 MG/ML IJ SOLN
15.0000 mg | Freq: Once | INTRAMUSCULAR | Status: AC
  Administered 2022-12-04: 15 mg via INTRAVENOUS
  Filled 2022-12-04: qty 1

## 2022-12-04 MED ORDER — NAPROXEN 500 MG PO TABS: 500.0000 mg | ORAL_TABLET | Freq: Two times a day (BID) | ORAL | 0 refills | Status: DC

## 2022-12-04 MED ORDER — SODIUM CHLORIDE 0.9 % IV BOLUS
1000.0000 mL | Freq: Once | INTRAVENOUS | Status: AC
  Administered 2022-12-04: 1000 mL via INTRAVENOUS

## 2022-12-04 MED ORDER — IOHEXOL 350 MG/ML SOLN
100.0000 mL | Freq: Once | INTRAVENOUS | Status: AC | PRN
  Administered 2022-12-04: 75 mL via INTRAVENOUS

## 2022-12-04 MED ORDER — DIPHENHYDRAMINE HCL 50 MG/ML IJ SOLN
25.0000 mg | Freq: Once | INTRAMUSCULAR | Status: AC
  Administered 2022-12-04: 25 mg via INTRAVENOUS
  Filled 2022-12-04: qty 1

## 2022-12-04 NOTE — Discharge Instructions (Addendum)
Your workup today was reassuring.  Please take the anti-inflammatory muscle relaxers as prescribed.  Do not drive or drink alcohol taking the muscle relaxers and may make you drowsy.  Follow-up with your primary care doctor or the neurologist at the number provided.  Return to the ER for worsening symptoms as we discussed.

## 2022-12-04 NOTE — ED Provider Notes (Signed)
[place] [facility] AT [facility] Provider Note   CSN: [id number] Arrival date & time: 12/04/22  1051     History  Chief Complaint  Patient presents with   Headache    KEVIAN Mcneil is a 52 y.o. male.  52 year old male with past medical history of asthma and migraine headaches presenting to the emergency department today with headache.  Patient states has been going now for the past 2 days.  He states that he is having pain over his entire head.  Somewhat of a difficult historian.  He states that the headache does feel different than headaches he has had in the past.  He states that he is having some pain in his neck this morning which she did not have initially.  He denies any associated fevers.  He states that he does have some numbness over his left side of his face and left arm as well with this.  The patient states that the numbness on the left side actually started last week.  He denies any associated weakness.  He states the headache started 2 days ago and has gradually worsened.  He states that he does have a history of migraine headaches but has been years since he has had any issues with this.  He denies any recent trauma.  He is unable to characterize the headache much more other than saying that it "hurts".   Headache Associated symptoms: no weakness        Home Medications Prior to Admission medications   Medication Sig Start Date End Date Taking? Authorizing Provider  methocarbamol (ROBAXIN) 500 MG tablet Take 1 tablet (500 mg total) by mouth 2 (two) times daily. 12/04/22  Yes Durwin Glaze, MD  naproxen (NAPROSYN) 500 MG tablet Take 1 tablet (500 mg total) by mouth 2 (two) times daily. 12/04/22  Yes Durwin Glaze, MD  allopurinol (ZYLOPRIM) 100 MG tablet Take 1 tablet (100 mg total) by mouth daily. 10/25/21   Myra Rude, MD  colchicine 0.6 MG tablet Take 1 tablet (0.6 mg total) by mouth 2 (two) times daily as needed. 10/25/21   Myra Rude, MD  HYDROcodone-acetaminophen (NORCO/VICODIN) 5-325 MG tablet Take 1 tablet by mouth every 8 (eight) hours as needed. 11/22/21   Myra Rude, MD  predniSONE (DELTASONE) 10 MG tablet Take 6 tablets (60 mg total) by mouth daily. 09/22/22   Tilden Fossa, MD  predniSONE (DELTASONE) 20 MG tablet TAKE 3 TABS ONCE DAILY FOR 3 DAYS, 2 FOR 3 DAYS, 1 FOR 2 DAYS THEN 1/2 FOR 2 DAYS 06/06/22   Myra Rude, MD      Allergies    Patient has no known allergies.    Review of Systems   Review of Systems  Neurological:  Positive for headaches. Negative for weakness.  All other systems reviewed and are negative.   Physical Exam Updated Vital Signs BP (!) 142/66   Pulse (!) 55   Temp 97.7 F (36.5 C) (Oral)   Resp 12   Ht 5\' 8"  (1.727 m)   Wt 124.7 kg   SpO2 100%   BMI 41.81 kg/m  Physical Exam Vitals and nursing note reviewed.   Gen: NAD Eyes: PERRL, EOMI HEENT: no oropharyngeal swelling Neck: trachea midline Resp: clear to auscultation bilaterally Card: RRR, no murmurs, rubs, or gallops Abd: nontender, nondistended Extremities: no calf tenderness, no edema Vascular: 2+ radial pulses bilaterally, 2+ DP pulses bilaterally Neuro: see NIH stroke scale Skin:  no rashes Psyc: acting appropriately   ED Results / Procedures / Treatments   Labs (all labs ordered are listed, but only abnormal results are displayed) Labs Reviewed  BASIC METABOLIC PANEL - Abnormal; Notable for the following components:      Result Value   Glucose, Bld 105 (*)    Creatinine, Ser 1.41 (*)    GFR, Estimated 59 (*)    All other components within normal limits  CBC    EKG None  Radiology CT ANGIO HEAD NECK W WO CM  Result Date: 12/04/2022 CLINICAL DATA:  Provided history: Neuro deficit, acute, stroke suspected. Additional history provided: Posterior headache since Thursday, neck pain, left arm tingling. EXAM: CT ANGIOGRAPHY HEAD AND NECK WITH AND WITHOUT CONTRAST TECHNIQUE: Multidetector CT  imaging of the head and neck was performed using the standard protocol during bolus administration of intravenous contrast. Multiplanar CT image reconstructions and MIPs were obtained to evaluate the vascular anatomy. Carotid stenosis measurements (when applicable) are obtained utilizing NASCET criteria, using the distal internal carotid diameter as the denominator. RADIATION DOSE REDUCTION: This exam was performed according to the departmental dose-optimization program which includes automated exposure control, adjustment of the mA and/or kV according to patient size and/or use of iterative reconstruction technique. CONTRAST:  75mL OMNIPAQUE IOHEXOL 350 MG/ML SOLN COMPARISON:  Head CT 03/31/2018. FINDINGS: CT HEAD FINDINGS Brain: Cerebral volume is normal. There is no acute intracranial hemorrhage. No demarcated cortical infarct. No extra-axial fluid collection. No evidence of an intracranial mass. No midline shift. Vascular: No hyperdense vessel. Skull: No calvarial fracture or aggressive osseous lesion. Sinuses/Orbits: No mass or acute finding within the imaged orbits. 15 mm mucous retention cyst within the right maxillary sinus. Moderate mucosal thickening within the left maxillary sinus. Mucous retention cyst within a posterior right ethmoid air cell. Review of the MIP images confirms the above findings CTA NECK FINDINGS Aortic arch: Common origin of the innominate and left common carotid arteries. The visualized thoracic aorta is normal in caliber. Mild atherosclerotic plaque within the distal innominate artery. No hemodynamically significant innominate or proximal subclavian artery stenosis. Right carotid system: CCA and ICA patent within the neck without stenosis or significant atherosclerotic disease. Left carotid system: CCA and ICA patent within the neck without stenosis or significant atherosclerotic disease Vertebral arteries: Venous reflux of contrast obscures portions of the left vertebral artery V1  segment. Within this limitation, the vertebral arteries are patent within the neck without stenosis or significant atherosclerotic disease. Skeleton: Spondylosis at the cervical and visualized upper thoracic levels. No appreciable high-grade spinal canal stenosis. Multilevel bony neural foraminal narrowing. Other neck: Nonspecific borderline enlarged right submental lymph node measuring 9-10 mm in short axis (series 605, image 239). Upper chest: No consolidation within the imaged lung apices. Review of the MIP images confirms the above findings CTA HEAD FINDINGS Anterior circulation: The intracranial internal carotid arteries are patent. The M1 middle cerebral arteries are patent. No M2 proximal branch occlusion or high-grade proximal stenosis. The anterior cerebral arteries are patent. No intracranial aneurysm is identified. Posterior circulation: The intracranial vertebral arteries are patent. The basilar artery is patent. The posterior cerebral arteries are patent. Posterior communicating arteries are present bilaterally. Venous sinuses: Within the limitations of contrast timing, no convincing thrombus. Anatomic variants: None significant. Review of the MIP images confirms the above findings IMPRESSION: Non-contrast head CT: 1.  No evidence of an acute intracranial abnormality. 2. Paranasal sinus disease as described. CTA neck: 1. Venous reflux of contrast obscures portions of  the left vertebral artery V1 segment. Within this limitation, the common carotid, internal carotid and vertebral arteries are patent within the neck without stenosis or significant atherosclerotic disease. 2. Cervical spondylosis. CTA head: No intracranial large vessel occlusion or proximal high-grade arterial stenosis. Electronically Signed   By: Jackey Loge D.O.   On: 12/04/2022 14:18    Procedures Procedures    Medications Ordered in ED Medications  prochlorperazine (COMPAZINE) injection 10 mg (10 mg Intravenous Given 12/04/22  1210)  diphenhydrAMINE (BENADRYL) injection 25 mg (25 mg Intravenous Given 12/04/22 1205)  iohexol (OMNIPAQUE) 350 MG/ML injection 100 mL (75 mLs Intravenous Contrast Given 12/04/22 1240)  sodium chloride 0.9 % bolus 1,000 mL (0 mLs Intravenous Stopped 12/04/22 1438)  methocarbamol (ROBAXIN) tablet 750 mg (750 mg Oral Given 12/04/22 1308)  ketorolac (TORADOL) 15 MG/ML injection 15 mg (15 mg Intravenous Given 12/04/22 1439)    ED Course/ Medical Decision Making/ A&P                   NIH Stroke Scale: 1              Medical Decision Making 52 year old male with past medical history of asthma and headaches in the past that he describes as migraine headaches presenting to the emergency department today with headache and neck pain.  The patient does report some neck pain does not have any meningismus here on exam.  I will further evaluate him here with basic labs Wels a CT scan of his head in addition to a CT angiogram of his head and neck to further evaluate for subarachnoid hemorrhage, vertebral artery dissection, intracranial mass/hemorrhage, or other intracranial abnormalities.  I will give the patient Compazine and Benadryl in the event that this is due to complex migraine.  His numbness in the left side actually preceded the headache which is somewhat abnormal.  He is clearly outside the window for acute neurointervention at this time.  I will reevaluate for ultimate disposition.  The patient's EKG interpreted by me shows a sinus rhythm with a rate of 56 with normal axis, normal intervals, nonspecific ST-T changes.  Looking back through the patient's chart it does appear that he had an essentially identical presentation back in 2020 it did improve with migraine treatment as well as muscle relaxers and IV fluids.  These were added.  I will continue the workup and reevaluate for ultimate disposition.  The patient symptoms resolved with the medications here.  He is no longer having any numbness.  He is  feeling much better.  I think that is stable for discharge.  He is discharged with return precautions.  Amount and/or Complexity of Data Reviewed Labs: ordered. Radiology: ordered.  Risk Prescription drug management.          Final Clinical Impression(s) / ED Diagnoses Final diagnoses:  Nonintractable headache, unspecified chronicity pattern, unspecified headache type  Paresthesia  Dispo: discharge  Rx / DC Orders ED Discharge Orders          Ordered    naproxen (NAPROSYN) 500 MG tablet  2 times daily        12/04/22 1457    methocarbamol (ROBAXIN) 500 MG tablet  2 times daily        12/04/22 1457              Durwin Glaze, MD 12/04/22 1458

## 2022-12-04 NOTE — ED Notes (Signed)
Pt transported to imaging.

## 2022-12-04 NOTE — ED Notes (Signed)
Pt ambulatory to bathroom, no assistance needed.  

## 2022-12-04 NOTE — ED Triage Notes (Addendum)
Pt reports posterior HA since Thursday. Has noted tingling in left arm since HA began . Woke up this morning with pain in neck. Blurry vision. Hx of migraine. Last migraine was 2 years/. Noted to have Decreased sensation to left. Mild droop left side of mouth

## 2022-12-06 NOTE — ED Provider Notes (Signed)
I was contacted by [facility] radiology in regards to an addendum of this patient who was seen overnight by another physician.  The addendum notes a nonspecific borderline enlarged right submental lymph node.  Patient was seen for headache and the rest of the CT angio was negative.  This finding does not seem like it would contribute to the patient's symptoms and does not appear to be emergent in this setting.  Outpatient follow-up appropriate.   Rozelle Logan, DO 12/06/22 [id number]

## 2023-12-26 ENCOUNTER — Other Ambulatory Visit: Payer: Self-pay

## 2023-12-26 ENCOUNTER — Ambulatory Visit (INDEPENDENT_AMBULATORY_CARE_PROVIDER_SITE_OTHER)

## 2023-12-26 VITALS — BP 120/80 | Ht 68.0 in | Wt 275.0 lb

## 2023-12-26 DIAGNOSIS — G5602 Carpal tunnel syndrome, left upper limb: Secondary | ICD-10-CM | POA: Diagnosis not present

## 2023-12-26 DIAGNOSIS — M25512 Pain in left shoulder: Secondary | ICD-10-CM

## 2023-12-26 DIAGNOSIS — M25532 Pain in left wrist: Secondary | ICD-10-CM

## 2023-12-26 DIAGNOSIS — M67814 Other specified disorders of tendon, left shoulder: Secondary | ICD-10-CM | POA: Diagnosis not present

## 2023-12-26 DIAGNOSIS — M65939 Unspecified synovitis and tenosynovitis, unspecified forearm: Secondary | ICD-10-CM

## 2023-12-26 MED ORDER — DICLOFENAC SODIUM 75 MG PO TBEC
75.0000 mg | DELAYED_RELEASE_TABLET | Freq: Two times a day (BID) | ORAL | 1 refills | Status: AC
Start: 2023-12-26 — End: ?

## 2023-12-26 NOTE — Progress Notes (Signed)
   Subjective:    Patient ID: Adam Mcneil, male    DOB: 53 y.o., 12-Aug-1970   MRN: [id number]  HPI  Chief Complaint: Left wrist pain, left forearm pain, left shoulder pain  53 year old male with past medical history significant for gout presenting with left wrist pain.  Reporting left wrist pain going on for about past year Numbness and tingling alongside severe pain Pain in his digits 2 through 5 No known inciting event Never had this before No prior history of surgery on the left wrist or hand Worse at night Initially started treatment with wrist cock up splint several weeks ago This helps take away pain while wearing Pain returns immediately as soon as he removes the brace Initially was intermittent Now the past several weeks the pain has been close to constant and daily  Left volar radial wrist pain Patient reporting this onset around a similar timeline to the more recent flare of the numbness/tingling he is experiencing in his left hand but feels distinctly different More of a dull pain that has intermittent sharp episodes associated with it Feels like it tracks up in his forearm No known inciting injury  Left shoulder pain Also complaining of left anterior shoulder pain without inciting injury No prior history of significant injury to the shoulder No prior surgery in shoulder He is right-hand dominant 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 Tenderness to palpation in the area of the long head of the biceps tendon  Limited ultrasound examination of the left upper extremity: Left median nerve cross-sectional  area 12 mm with a flattened appearance at the level of the transverse carpal ligament. Mild hypoechogenic fluid signal occupying the peritendinous region near the insertion of the flexor carpi radialis tendons Heterogeneous echotexture to biceps tendon.  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  I believe his occupation is directly related to the development of the symptoms.  Unfortunately, due to the nature of his work he states he is unable to do light duty.  I have provided him with a work note specifically requesting that accommodations be made for him to limit lifting to less than 10 pounds on a regular basis for the next several weeks, with the stipulation that if this cannot be accommodated that I recommend he remain out of work for 4 weeks.  I have sent in a prescription for diclofenac and physical therapy and we will see him back next available appointment for ultrasound-guided carpal tunnel injection.  Follow-up visit scheduled 4 weeks hence to reevaluate wrist and shoulder.

## 2024-01-01 ENCOUNTER — Ambulatory Visit

## 2024-01-01 ENCOUNTER — Other Ambulatory Visit: Payer: Self-pay

## 2024-01-01 VITALS — BP 120/88 | Ht 68.0 in | Wt 275.0 lb

## 2024-01-01 DIAGNOSIS — G5602 Carpal tunnel syndrome, left upper limb: Secondary | ICD-10-CM

## 2024-01-01 MED ORDER — METHYLPREDNISOLONE ACETATE 40 MG/ML IJ SUSP
40.0000 mg | Freq: Once | INTRAMUSCULAR | Status: AC
Start: 2024-01-01 — End: 2024-01-01
  Administered 2024-01-01: 20 mg via INTRA_ARTICULAR

## 2024-01-01 NOTE — Progress Notes (Signed)
   Subjective:    Patient ID: Adam Mcneil, male    DOB: 53 y.o., June 03, 1970   MRN: [id number]  Chief Complaint: Left-sided carpal tunnel syndrome  Patient previously evaluated by myself in 12/26/2023 where his median nerve on the left side was noted to have a cross-sectional area of 12 mm on ultrasonographic examination.  He did have positive Tinel's, Durkan's, Phalen's on that side as well.  Presents today specifically for ultrasound-guided corticosteroid injection of his median nerve.  Reports his symptoms are unchanged from last visit.    Objective:   Vitals:   01/01/24 0921  BP: 120/88    Carpal Tunnel Injection with Ultrasound Guidance Procedure Note  Adam Mcneil 03/13/1971 Indications: Numbness Procedure Details Following the description of risks including infection bleeding, damage to surrounding structures, patient provided verbal/written consent for left carpal tunnel injection procedure with ultrasound guidance. Patient was sterilely prepped in the usual fashion with chlorhexidine. Following topical anesthetization with ethyl chloride, sterile ultrasound technique was used to identify the injection today. Patient was then injected with a solution of 20mg  Depo-medrol , 4cc of 2% Mepivicaine, and 0.25cc of 8.4% Sodium Bicarbonate. This was well visualized under ultrasound, please see associated photographic documentation. Patient tolerated well without complication. Precautions provided. Cleaned and dressing applied.     Assessment & Plan:   Adam Mcneil is a 53 year old male presenting for follow-up on left carpal tunnel. Provided patient with ultrasound-guided left median nerve injection today.  Tolerated procedure well.  Recommend continue wrist cock-up splint use at home.  Follow-up if worsens or persists.  Consider referral to hand surgery if pain does not resolve/improved.

## 2024-01-09 ENCOUNTER — Ambulatory Visit

## 2024-01-09 NOTE — Therapy (Incomplete)
 OUTPATIENT PHYSICAL THERAPY SHOULDER EVALUATION   Patient Name: Adam Mcneil MRN: [id number] DOB:12-Sep-1970, 53 y.o., male Today's Date: 01/09/2024  END OF SESSION:   Past Medical History:  Diagnosis Date   Asthma    Migraine    No past surgical history on file. 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 10/25/2021   Wrist arthropathy 06/30/2021   Scapho-lunate dissociation, left 06/30/2021   Ankle effusion, left 12/15/2020   Strain of calf muscle 10/29/2020   Left hip pain 01/30/2020   Quadriceps tendinitis 02/26/2019   Patellar tendinitis of both knees 02/26/2019   CKD (chronic kidney disease), stage II 09/13/2018   Acute pancreatitis    Acute abdominal pain in right upper quadrant 09/12/2018   OA (osteoarthritis) of knee 01/23/2017    PCP: none  REFERRING PROVIDER: Charles Redell LABOR, DO   REFERRING DIAG: Biceps tendinosis of left shoulder [M67.814], Flexor carpi radialis tenosynovitis [M65.939]   THERAPY DIAG:  No diagnosis found.  Rationale for Evaluation and Treatment: Rehabilitation  ONSET DATE: ***  SUBJECTIVE:                                                                                                                                                                                      SUBJECTIVE STATEMENT: Patient reporting to PT with L shoulder and wrist pain that has been present for at least 1 year. His pain is limiting participation in occupational duties.   Hand dominance: {MISC; OT HAND DOMINANCE:[phone]}  PERTINENT HISTORY: *** Relevant PMHx includes gout  PAIN:  Are you having pain? {OPRCPAIN:27236}  PRECAUTIONS: {Therapy precautions:24002}  RED FLAGS: {PT Red Flags:29287}   WEIGHT BEARING RESTRICTIONS: {Yes ***/No:24003}  FALLS:  Has patient fallen in last 6 months?  {fallsyesno:27318}  LIVING ENVIRONMENT: Lives with: {OPRC lives with:25569::lives with their family} Lives in: {Lives in:25570} Stairs: {opstairs:27293} Has following equipment at home: {Assistive devices:23999}  OCCUPATION: ***  PLOF: {PLOF:24004}  PATIENT GOALS:***  NEXT MD VISIT:   OBJECTIVE:  Note: Objective measures were completed at Evaluation unless otherwise noted.  DIAGNOSTIC FINDINGS:  ***  PATIENT SURVEYS:  {rehab surveys:24030:a}  COGNITION: Overall cognitive status: {cognition:24006}     SENSATION: {sensation:27233}  POSTURE: ***  UPPER EXTREMITY ROM:   {AROM/PROM:27142} ROM Right eval Left eval  Shoulder flexion    Shoulder extension    Shoulder abduction    Shoulder adduction    Shoulder internal rotation    Shoulder external rotation    Elbow flexion    Elbow extension    Wrist flexion  Wrist extension    Wrist ulnar deviation    Wrist radial deviation    Wrist pronation    Wrist supination    (Blank rows = not tested)  UPPER EXTREMITY MMT:  MMT Right eval Left eval  Shoulder flexion    Shoulder extension    Shoulder abduction    Shoulder adduction    Shoulder internal rotation    Shoulder external rotation    Middle trapezius    Lower trapezius    Elbow flexion    Elbow extension    Wrist flexion    Wrist extension    Wrist ulnar deviation    Wrist radial deviation    Wrist pronation    Wrist supination    Grip strength (lbs)    (Blank rows = not tested)  SHOULDER SPECIAL TESTS: Impingement tests: {shoulder impingement test:25231:a} SLAP lesions: {SLAP lesions:25232} Instability tests: {shoulder instability test:25233} Rotator cuff assessment: {rotator cuff assessment:25234} Biceps assessment: {biceps assessment:25235}  JOINT MOBILITY TESTING:  ***  PALPATION:  ***                                                                                                                             TREATMENT DATE:  ***   PATIENT EDUCATION: Education details: *** Person educated: {Person educated:25204} Education method: {Education Method:25205} Education comprehension: {Education Comprehension:25206}  HOME EXERCISE PROGRAM: ***  ASSESSMENT:  CLINICAL IMPRESSION: Patient is a *** y.o. *** who was seen today for physical therapy evaluation and treatment for ***.   OBJECTIVE IMPAIRMENTS: {opptimpairments:25111}.   ACTIVITY LIMITATIONS: {activitylimitations:27494}  PARTICIPATION LIMITATIONS: {participationrestrictions:25113}  PERSONAL FACTORS: {Personal factors:25162} are also affecting patient's functional outcome.   REHAB POTENTIAL: {rehabpotential:25112}  CLINICAL DECISION MAKING: {clinical decision making:25114}  EVALUATION COMPLEXITY: {Evaluation complexity:25115}   GOALS: Goals reviewed with patient? {yes/no:20286}  SHORT TERM GOALS: Target date: ***  *** Baseline: Goal status: INITIAL  2.  *** Baseline:  Goal status: INITIAL  3.  *** Baseline:  Goal status: INITIAL  4.  *** Baseline:  Goal status: INITIAL  5.  *** Baseline:  Goal status: INITIAL  6.  *** Baseline:  Goal status: INITIAL  LONG TERM GOALS: Target date: ***  *** Baseline:  Goal status: INITIAL  2.  *** Baseline:  Goal status: INITIAL  3.  *** Baseline:  Goal status: INITIAL  4.  *** Baseline:  Goal status: INITIAL  5.  *** Baseline:  Goal status: INITIAL  6.  *** Baseline:  Goal status: INITIAL  PLAN:  PT FREQUENCY: {rehab frequency:25116}  PT DURATION: {rehab duration:25117}  PLANNED INTERVENTIONS: {rehab planned interventions:25118::97110-Therapeutic exercises,97530- Therapeutic [phone]- Neuromuscular re-education,97535- Self Rjmz,02859- Manual therapy,Patient/Family education}  PLAN FOR NEXT SESSION: PIERRETTE Marko Molt, PT 01/09/2024, 4:25 PM

## 2024-01-22 ENCOUNTER — Ambulatory Visit (INDEPENDENT_AMBULATORY_CARE_PROVIDER_SITE_OTHER): Payer: Worker's Compensation

## 2024-01-22 ENCOUNTER — Other Ambulatory Visit: Payer: Self-pay

## 2024-01-22 VITALS — BP 138/84 | Ht 68.0 in | Wt 275.0 lb

## 2024-01-22 DIAGNOSIS — M654 Radial styloid tenosynovitis [de Quervain]: Secondary | ICD-10-CM | POA: Diagnosis not present

## 2024-01-22 DIAGNOSIS — M65939 Unspecified synovitis and tenosynovitis, unspecified forearm: Secondary | ICD-10-CM | POA: Diagnosis not present

## 2024-01-22 DIAGNOSIS — G5602 Carpal tunnel syndrome, left upper limb: Secondary | ICD-10-CM | POA: Diagnosis not present

## 2024-01-22 MED ORDER — METHYLPREDNISOLONE ACETATE 40 MG/ML IJ SUSP
40.0000 mg | Freq: Once | INTRAMUSCULAR | Status: AC
Start: 2024-01-22 — End: 2024-01-22
  Administered 2024-01-22: 30 mg via INTRA_ARTICULAR

## 2024-01-22 NOTE — Progress Notes (Signed)
   Subjective:    Patient ID: Adam Mcneil, male    DOB: 53 y.o., Feb 08, 1971   MRN: [id number]  Chief Complaint: Follow-up  - left carpal tunnel syndrome, left flexor carpi radialis tenosynovitis, left biceps tendinosis  Discussed the use of AI scribe software for clinical note transcription with the patient, who gave verbal consent to proceed.  Patient underwent carpal tunnel corticosteroid injection on 01/01/2024 with myself and instructed to wear wrist cock-up splint which she was fitted for at that time.  History of Present Illness Adam Mcneil is a 53 year old male who presents with persistent wrist pain and numbness radiating into the fingers.  Wrist pain and paresthesia - Severe wrist pain radiating into the fingers - Associated numbness in the fingers - Pain worsens with certain movements, especially pressing along the tendon - Pain is more pronounced on the side of the wrist - Symptoms recur approximately two weeks after an injection that initially provides relief  Functional impairment - Unable to work due to wrist pain and numbness - Currently on short-term disability - Requires documentation to extend leave until evaluation by a specialist     Objective:   Vitals:   01/22/24 0928  BP: 138/84   Left upper extremity exam: Positive Tinel's, Durkan's, Phalen's Diminished tenderness to palpation along the course of the distal two thirds of the flexor carpi radialis tendons compared to prior exam. Positive WHAT test.  Tenderness palpation at the radial styloid.  Negative Finkelstein's.  Equivocal Eckhoff's.  De Quervain's Injection with Ultrasound Guidance Adam Mcneil Aug 03, 1970 Indications: Pain Procedure Details Following the description of risks including infection bleeding, damage to surrounding structures, atrophy, and hypo-pigmentation, patient provided written consent for left dequervain sheath and flexor carpi radialis corticosteroid  injection procedure with ultrasound guidance. Palpated for for the dequervain sheath. US  was then used to identify the first dorsal compartment. Patient was sterilely prepped in the usual fashion with alcohol. Sterile US  gel was used for the procedure. Following topical anesthetization with ethyl chloride, the tendon sheaths were each injected with half the volume of a solution of 30mg  depo-medrol , 1 cc 2% Mepivicaine, 0.25cc Sodium Bicarbonate 8.4%. This was well visualized under ultrasound, please see associated photographic documentation.  Patient tolerated well without complication.  Precautions provided.     Assessment & Plan:   Assessment & Plan Left carpal tunnel syndrome   Chronic left carpal tunnel syndrome presents with pain radiating into the fingers, numbness, and tenderness. Previous injection provided relief for two weeks, but persistent symptoms despite conservative management suggest surgical intervention is needed. Refer to a hand surgeon for evaluation and discussion of surgical options, including open or ultrasound-guided release of the transverse carpal ligament. Recommend ultrasound-guided release for faster recovery. Provide a work note to extend short-term disability and prevent return to work until surgical consultation is completed.  Left De Quervain's tenosynovitis and left flexor carpi radialis tendinitis   Tenderness at the radial styloid and equivocal Finkelstein's test suggest De Quervain's tenosynovitis. Tenderness near the insertion of the flexor carpi radialis tendon indicates possible tendinitis, with inflammation noted in both areas. Administer De Quervain's tenosynovitis injection, considering an additional injection at the flexor carpi radialis tendon if inflammation is visible. Discussed injection options, including prolotherapy with sugar water as an alternative to steroids, which has comparable outcomes for carpal tunnel without the side effects.

## 2024-01-22 NOTE — Addendum Note (Signed)
 Addended by: MARCINE HARLENE SAILOR on: 01/22/2024 10:02 AM   Modules accepted: Orders

## 2024-01-23 ENCOUNTER — Ambulatory Visit

## 2024-02-10 NOTE — Progress Notes (Unsigned)
 Adam Mcneil - 53 y.o. male MRN [id number]  Date of birth: Mar 29, 1970  Office Visit Note: Visit Date: 02/11/2024 PCP: Patient, No Pcp Per Referred by: Charles Redell LABOR, DO  Subjective: No chief complaint on file.  HPI: Adam Mcneil is a pleasant 53 y.o. male who presents today for evaluation of ongoing numbness and tingling in the radial aspect of the left hand with associated radial sided pain along the wrist region.  States that the pain will radiate into the thumb and proximal forearm as well, is related to heavy pinch and grip activities.  Feels that the numbness and tingling is becoming more progressive, is describing nocturnal symptoms as well which no longer respond to wrist positioning or bracing.  Is also undergone previous ultrasound-guided injection to the left carpal tunnel with temporary relief, symptoms have since recurred.  Injection was performed in early October.  Pertinent ROS were reviewed with the patient and found to be negative unless otherwise specified above in HPI.   Visit Reason: left wrist carpal tunnel syndrome, de Quervain's tenosynovitis Duration of symptoms: 2 months Hand dominance: right Occupation: Licensed Conveyancer Diabetic: No Smoking: No Heart/Lung History: none Blood Thinners: none  Prior Testing/EMG: US  Injections (Date): 01/01/24 Treatments: injection Prior Surgery: none    Assessment & Plan: Visit Diagnoses:  1. Carpal tunnel syndrome, left upper limb   2. De Quervain's tenosynovitis, left     Plan: Extensive discussion was had with the patient today about his ongoing left sided carpal tunnel syndrome that is refractory to conservative care.  Patient has clinical evidence to confirm this diagnosis with prior injection for diagnostic and therapeutic purposes.  At this juncture, he is indicated for left open versus endoscopic carpal tunnel release.  Risks and benefits of both operations were discussed in detail today.  Extensive  discussion was also had with the patient today regarding his left wrist radial sided pain.  Patient has signs and symptoms consistent with de Quervain's tenosynovitis.  We discussed the underlying etiology and pathophysiology of this condition as well as treatment modalities ranging from conservative to surgical.  From a conservative standpoint, we discussed activity modification, anti-inflammatory medication both topical and oral, bracing, therapy and cortisone injections.  From a surgical standpoint, I explained to patient that we can perform left wrist first extensor compartment release and possible tenosynovectomy in conjunction with his carpal tunnel release.  Given that he has also trialed extensive nonsurgical treatments for the left wrist pain, he would like to proceed with both left open carpal tunnel release and first extensor compartment release with possible tenosynovectomy at the next available date.  Risks include but not limited to infection, bleeding, scarring, stiffness, nerve injury or vascular, tendon injury, risk of recurrence and need for subsequent operation were all discussed in detail.  Patient consented understanding the above.  Will move forward surgical scheduling.   Follow-up: No follow-ups on file.   Meds & Orders: No orders of the defined types were placed in this encounter.  No orders of the defined types were placed in this encounter.    Procedures: No procedures performed      Clinical History: No specialty comments available.  He reports that he has never smoked. He has never used smokeless tobacco. No results for input(s): HGBA1C, LABURIC in the last 8760 hours.  Objective:   Vital Signs: There were no vitals taken for this visit.  Physical Exam  Gen: Well-appearing, in no acute distress; non-toxic CV: Regular Rate. Well-perfused. Warm.  Resp: Breathing unlabored on room air; no wheezing. Psych: Fluid speech in conversation; appropriate affect;  normal thought process  Ortho Exam PHYSICAL EXAM:  General: Patient is well appearing and in no distress.   Skin and Muscle: No SNF skin changes are apparent to upper extremities.   Range of Motion and Palpation Tests: Mobility is full about the elbows with flexion and extension.  Forearm supination and pronation are 85/85 bilaterally.  Wrist flexion/extension is 75/65 right side, flexion/extension 55/45 on the left, limited secondary to pain.  Digital flexion and extension are full.  Thumb opposition is full to the base of the small fingers bilaterally.    No cords or nodules are palpated.  No triggering is observed.    Finklestein test is positive left with associated swelling.   Neurologic, Vascular, Motor: Sensation is diminished to light touch in the left median nerve distribution.    Thenar atrophy: Negative left Tinel sign: Positive left carpal tunnel Carpal tunnel compression: Positive left Phalen test: Positive left   Motor left hand FPL: 5/5 Index FDP: 5/5 APB: 5/5 Thumb opposition is preserved  Fingers pink and well perfused.  Capillary refill is brisk.     No results found for: HGBA1C    Imaging: No results found.  Past Medical/Family/Surgical/Social History: Medications & Allergies reviewed per EMR, new medications updated. 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 No past surgical history on file. Social History   Occupational History   Not on file  Tobacco Use   Smoking status: Never   Smokeless tobacco: Never  Vaping Use   Vaping status: Never Used  Substance and Sexual Activity   Alcohol use: Yes    Comment: occassional   Drug use: No   Sexual activity: Not on file    Nathifa Ritthaler Estela) Arlinda, M.D. [facility], Hand Surgery

## 2024-02-11 ENCOUNTER — Ambulatory Visit (INDEPENDENT_AMBULATORY_CARE_PROVIDER_SITE_OTHER): Admitting: Orthopedic Surgery

## 2024-02-11 DIAGNOSIS — M654 Radial styloid tenosynovitis [de Quervain]: Secondary | ICD-10-CM

## 2024-02-11 DIAGNOSIS — G5602 Carpal tunnel syndrome, left upper limb: Secondary | ICD-10-CM

## 2024-02-18 ENCOUNTER — Other Ambulatory Visit: Payer: Self-pay

## 2024-02-18 DIAGNOSIS — G5602 Carpal tunnel syndrome, left upper limb: Secondary | ICD-10-CM

## 2024-02-18 DIAGNOSIS — M654 Radial styloid tenosynovitis [de Quervain]: Secondary | ICD-10-CM

## 2024-02-27 ENCOUNTER — Other Ambulatory Visit: Payer: Self-pay | Admitting: Orthopedic Surgery

## 2024-02-27 MED ORDER — OXYCODONE HCL 5 MG PO TABS: 5.0000 mg | ORAL_TABLET | Freq: Four times a day (QID) | ORAL | 0 refills | Status: AC | PRN

## 2024-03-11 NOTE — Therapy (Signed)
 OUTPATIENT OCCUPATIONAL THERAPY ORTHO EVALUATION  Patient Name: Adam Mcneil MRN: [id number] DOB:[birth year], 53 y.o., male Today's Date: 03/12/2024  PCP: N/A REFERRING PROVIDER: Dr. Arlinda   END OF SESSION:  OT End of Session - 03/12/24 1148     Visit Number 1    Number of Visits 5    Date for Recertification  04/18/24    Authorization Type BCBS    OT Start Time 1148    OT Stop Time 1216    OT Time Calculation (min) 28 min    Equipment Utilized During Treatment Wound care materials    Activity Tolerance Patient tolerated treatment well;No increased pain;Patient limited by fatigue;Patient limited by pain    Behavior During Therapy [facility] for tasks assessed/performed          Past Medical History:  Diagnosis Date   Asthma    Migraine    History reviewed. No pertinent surgical history. 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 10/25/2021   Wrist arthropathy 06/30/2021   Scapho-lunate dissociation, left 06/30/2021   Ankle effusion, left 12/15/2020   Strain of calf muscle 10/29/2020   Left hip pain 01/30/2020   Quadriceps tendinitis 02/26/2019   Patellar tendinitis of both knees 02/26/2019   CKD (chronic kidney disease), stage II 09/13/2018   Acute pancreatitis    Acute abdominal pain in right upper quadrant 09/12/2018   OA (osteoarthritis) of knee 01/23/2017    ONSET DATE: DOS 02/28/24  REFERRING DIAG:  G56.02 (ICD-10-CM) - Carpal tunnel syndrome, left upper limb  M65.4 (ICD-10-CM) - De Quervain's tenosynovitis, left    THERAPY DIAG:  Localized edema  Pain in left wrist  Pain in left hand  Stiffness of left hand, not elsewhere classified  Other lack of coordination  Rationale for Evaluation and Treatment: Rehabilitation  SUBJECTIVE:   SUBJECTIVE STATEMENT: He is now 2 weeks s/p Lt CTR and DQR surgeries.  He  states he is having some pain and stiffness, and he looks forward to being seen in therapy on a semiregular basis as needed.  He makes pain at work, has a lot heavy use of the hand.     PERTINENT HISTORY: Pain in the left wrist with paresthesia in the hand leading up to surgical release of the first dorsal compartment and the carpal tunnel  PRECAUTIONS: None  RED FLAGS: None   WEIGHT BEARING RESTRICTIONS: Yes no lifting pushing or pulling greater than 1 or 2 pounds for the next 3-4 weeks with the left hand  PAIN:  Are you having pain? Yes: NPRS scale: 2-3/10 at rest now  Pain location: Left surgical sites and swollen hand Pain description: Aching and sore Aggravating factors: Motion or attempted weightbearing Relieving factors: Rest  FALLS: Has patient fallen in last 6 months? No  PLOF: Independent  PATIENT GOALS: To safely improve the use of his left hand and arm for daily tasks  NEXT MD VISIT: Approximately 4 weeks  OBJECTIVE: (All objective assessments below are from initial evaluation on: 03/12/2024 unless otherwise specified.)   HAND DOMINANCE: Right   ADLs: Overall ADLs: States decreased ability to grab, hold household objects, pain and difficulty to open containers, perform FMS tasks (manipulate fasteners on clothing), mild to moderate bathing problems as well.    FUNCTIONAL OUTCOME MEASURES: Eval: Quick DASH 32% impairment today  (Higher % Score  =  More Impairment)  UPPER EXTREMITY ROM     Shoulder to Wrist AROM Left eval  Forearm supination ~75  Forearm pronation  ~80  Wrist flexion 56  Wrist extension 43  (Blank rows = not tested)   Hand AROM Left eval  Full Fist Ability (or Gap to Distal Palmar Crease) Unable due to pain and stiffness  Thumb Opposition  (Kapandji Scale)  4/10  (Blank rows = not tested)   UPPER EXTREMITY MMT:    Eval:  NT at eval due to recent and still healing injuries. Will be tested when appropriate.   MMT Left TBD   Elbow flexion   Elbow extension   Forearm supination   Forearm pronation   Wrist flexion   Wrist extension   Wrist ulnar deviation   Wrist radial deviation   (Blank rows = not tested)  HAND FUNCTION: Eval: Observed weakness in affected Lt hand.  Details TBD when safe Grip strength Right: TBD lbs, Left: TBD lbs   COORDINATION: Eval: Observed coordination impairments with affected left hand.  Details TBD in the next session as time allows 9 Hole Peg Test  Left: TBD sec (~25 sec is WFL)   SENSATION: Eval:  Light touch intact today, though diminished around sx area    EDEMA:   Eval:  Mildly swollen in left hand and wrist today, compared to the right hand and wrist  COGNITION: Eval: Overall cognitive status: WFL for evaluation today   OBSERVATIONS:   Eval: Surgical sites are well-healing with glue in place in the first dorsal compartment, sutures recently removed in the palm and no signs of dehiscence or infection.  Tenderness and swelling within normal limits for this time postop   Left carpal tunnel release/de Quervain's release   TODAY'S TREATMENT:  Post-evaluation treatment:   For safety/self-care, the patient was recommended to do no pushing/pulling or weightbearing through the left hand or arm now.  The patient was taught to care for the postsurgical wound by doing light touch desensitization, gentle scar mobilizations, keep moist with a Vaseline, keep covered until completely healed.  The patient should not be soaking the wound, either.  The patient can quickly shower and dab dry.  The patient was given a compressive stockinette to help with swelling.  The patient was also given the following home exercise program to perform in a nonpainful fashion approximately 4 times a day.  Each one was reviewed with the patient, with performance back to show understanding and no significant pain.  The patient leaves without any questions or concerns.  Exercises - Standing Radial Nerve  Glide  - 4-6 x daily - 1 sets - 10-15 reps - Median Nerve Flossing  - 3 x daily - 5 reps - Turn Palm Facing Up & Down  - 4-6 x daily - 10-15 reps - Bend and Pull Back Wrist SLOWLY  - 4 x daily - 10-15 reps - Windshield Wipers   - 4 x daily - 10-15 reps - Tendon Glides  - 4-6 x daily - 3-5 reps - 2-3 seconds hold - Thumb Opposition  - 4-6 x daily - 10 reps Patient Education - Scar Massage   PATIENT EDUCATION: Education details: See tx section above for details  Person educated: Patient Education method: Verbal Instruction, Teach back, Handouts  Education comprehension: States and demonstrates understanding, Additional Education required    HOME EXERCISE PROGRAM: Access Code: [email or web address] URL: [email or web address] Date: 03/12/2024 Prepared by: Melvenia Ada   GOALS: Goals reviewed with patient? Yes   SHORT TERM  GOALS: (STG required if POC>30 days) Target Date: 03/28/24  Pt will demo/state understanding of initial HEP to improve pain levels and prerequisite motion. Goal status: INITIAL   LONG TERM GOALS: Target Date: 04/18/2024  Pt will improve functional ability by decreased impairment per QuickDASH assessment from 32% to 10% or better, for better quality of life. Goal status: INITIAL  2.  Pt will improve grip strength in left hand from unsafe to test to at least 35 lbs for functional use at home and in IADLs. Goal status: INITIAL  3.  Pt will improve A/ROM in left wrist flexion/extension from 56/43 degrees respectively to at least 60 degrees each, to have functional motion for tasks like reach and grasp.  Goal status: INITIAL  4.  Pt will improve strength in left wrist flexion/extension from apparent 3 -/5 MMT to at least 4+/5 MMT to have increased functional ability to carry out selfcare and higher-level homecare tasks with less difficulty. Goal status: INITIAL  5.  Pt will improve coordination skills in left hand and arm, as seen by within functional  limit score on nine-hole peg testing to have increased functional ability to carry out fine motor tasks (fasteners, etc.) and more complex, coordinated IADLs (meal prep, sports, etc.).  Goal status: INITIAL  6.  Pt will decrease pain at rest from 3/10 to 0/10 or better to have better sleep and occupational participation in daily roles. Goal status: INITIAL   ASSESSMENT:  CLINICAL IMPRESSION: Patient is a 53y.o. male who was seen today for occupational therapy evaluation for swelling, weakness, stiffness, decreased strength and functional ability in the left hand and arm after chronic pain and problems and subsequent surgical releases.  The patient will benefit from outpatient occupational therapy to decrease symptoms, improve functional upper extremity use, and increase quality of life.  PERFORMANCE DEFICITS: in functional skills including ADLs, IADLs, coordination, dexterity, sensation, edema, ROM, strength, pain, flexibility, Fine motor control, body mechanics, endurance, decreased knowledge of precautions, wound, and UE functional use, cognitive skills including problem solving and safety awareness, and psychosocial skills including coping strategies, environmental adaptation, habits, and routines and behaviors.   IMPAIRMENTS: are limiting patient from ADLs, IADLs, rest and sleep, and leisure.   COMORBIDITIES: may have co-morbidities  that affects occupational performance. Patient will benefit from skilled OT to address above impairments and improve overall function.  MODIFICATION OR ASSISTANCE TO COMPLETE EVALUATION: No modification of tasks or assist necessary to complete an evaluation.  OT OCCUPATIONAL PROFILE AND HISTORY: Detailed assessment: Review of records and additional review of physical, cognitive, psychosocial history related to current functional performance.  CLINICAL DECISION MAKING: LOW - limited treatment options, no task modification necessary  REHAB POTENTIAL:  Excellent  EVALUATION COMPLEXITY: Low      PLAN:  OT FREQUENCY: 1-2x/week  OT DURATION: 5 weeks through 04/18/2024 and up to 5 total visits as needed   PLANNED INTERVENTIONS: 97535 self care/ADL training, 02889 therapeutic exercise, 97530 therapeutic activity, 97112 neuromuscular re-education, 97140 manual therapy, 97035 ultrasound, 97032 electrical stimulation (manual), 97760 Orthotic Initial, [id number] Orthotic/Prosthetic subsequent, compression bandaging, Dry needling, energy conservation, coping strategies training, and patient/family education  RECOMMENDED OTHER SERVICES: none now    CONSULTED AND AGREED WITH PLAN OF CARE: Patient  PLAN FOR NEXT SESSION:   Review initial HEP and recommendations    Melvenia Ada, OTR/L, CHT  03/12/2024, 1:57 PM

## 2024-03-12 ENCOUNTER — Ambulatory Visit: Admitting: Orthopedic Surgery

## 2024-03-12 ENCOUNTER — Encounter: Payer: Self-pay | Admitting: Rehabilitative and Restorative Service Providers"

## 2024-03-12 ENCOUNTER — Ambulatory Visit: Admitting: Rehabilitative and Restorative Service Providers"

## 2024-03-12 DIAGNOSIS — M25532 Pain in left wrist: Secondary | ICD-10-CM

## 2024-03-12 DIAGNOSIS — M25642 Stiffness of left hand, not elsewhere classified: Secondary | ICD-10-CM | POA: Diagnosis not present

## 2024-03-12 DIAGNOSIS — R278 Other lack of coordination: Secondary | ICD-10-CM | POA: Diagnosis not present

## 2024-03-12 DIAGNOSIS — M79642 Pain in left hand: Secondary | ICD-10-CM | POA: Diagnosis not present

## 2024-03-12 DIAGNOSIS — R600 Localized edema: Secondary | ICD-10-CM | POA: Diagnosis not present

## 2024-03-12 DIAGNOSIS — M654 Radial styloid tenosynovitis [de Quervain]: Secondary | ICD-10-CM

## 2024-03-12 DIAGNOSIS — G5602 Carpal tunnel syndrome, left upper limb: Secondary | ICD-10-CM

## 2024-03-12 NOTE — Progress Notes (Unsigned)
° °  Adam Mcneil - 53 y.o. male MRN [id number]  Date of birth: 1971-01-29  Office Visit Note: Visit Date: 03/12/2024 PCP: Patient, No Pcp Per Referred by: No ref. provider found  Subjective:  HPI: Adam Mcneil is a 53 y.o. male who presents today for follow up 2 weeks status post left wrist first extensor compartment release with tenosynovectomy. Left wrist open carpal tunnel release.  Pertinent ROS were reviewed with the patient and found to be negative unless otherwise specified above in HPI.   Assessment & Plan: Visit Diagnoses: No diagnosis found.  Plan: ***  Follow-up: No follow-ups on file.   Meds & Orders: No orders of the defined types were placed in this encounter.  No orders of the defined types were placed in this encounter.    Procedures: No procedures performed       Objective:   Vital Signs: There were no vitals taken for this visit.  Ortho Exam ***  Imaging: No results found.   Bertrice Leder Afton Alderton, M.D. [facility], Hand Surgery

## 2024-03-24 NOTE — Therapy (Incomplete)
 " OUTPATIENT OCCUPATIONAL THERAPY TREATMENT NOTE   Patient Name: Adam Mcneil MRN: [id number] DOB:[birth year], 53 y.o., male Today's Date: 03/24/2024  PCP: N/A REFERRING PROVIDER: Dr. Arlinda   END OF SESSION:    Past Medical History:  Diagnosis Date   Asthma    Migraine    No past surgical history on file. 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 10/25/2021   Wrist arthropathy 06/30/2021   Scapho-lunate dissociation, left 06/30/2021   Ankle effusion, left 12/15/2020   Strain of calf muscle 10/29/2020   Left hip pain 01/30/2020   Quadriceps tendinitis 02/26/2019   Patellar tendinitis of both knees 02/26/2019   CKD (chronic kidney disease), stage II 09/13/2018   Acute pancreatitis    Acute abdominal pain in right upper quadrant 09/12/2018   OA (osteoarthritis) of knee 01/23/2017    ONSET DATE: DOS 02/28/24  REFERRING DIAG:  G56.02 (ICD-10-CM) - Carpal tunnel syndrome, left upper limb  M65.4 (ICD-10-CM) - De Quervain's tenosynovitis, left    THERAPY DIAG:  No diagnosis found.  Rationale for Evaluation and Treatment: Rehabilitation  PERTINENT HISTORY: Pain in the left wrist with paresthesia in the hand leading up to surgical release of the first dorsal compartment and the carpal tunnel He states he is having some pain and stiffness, and he looks forward to being seen in therapy on a semiregular basis as needed.  He makes pain at work, has a lot heavy use of the hand.   PRECAUTIONS: None  RED FLAGS: None   WEIGHT BEARING RESTRICTIONS: Yes no lifting pushing or pulling greater than 1 or 2 pounds for the next 3-4 weeks with the left hand   SUBJECTIVE:   SUBJECTIVE STATEMENT: He is now 4 weeks s/p Lt CTR and DQR surgeries.  He states ***.     PAIN:  Are you having pain? Yes: NPRS scale: ***/10 at rest now  Pain  location: Left surgical sites and swollen hand Pain description: Aching and sore Aggravating factors: Motion or attempted weightbearing Relieving factors: Rest   PATIENT GOALS: To safely improve the use of his left hand and arm for daily tasks  NEXT MD VISIT: Approximately 4 weeks   OBJECTIVE: (All objective assessments below are from initial evaluation on: 03/12/2024 unless otherwise specified.)   HAND DOMINANCE: Right   ADLs: Overall ADLs: States decreased ability to grab, hold household objects, pain and difficulty to open containers, perform FMS tasks (manipulate fasteners on clothing), mild to moderate bathing problems as well.    FUNCTIONAL OUTCOME MEASURES: Eval: Quick DASH 32% impairment today  (Higher % Score  =  More Impairment)     UPPER EXTREMITY ROM     Shoulder to Wrist AROM Left eval Lt 03/25/24  Forearm supination ~75   Forearm pronation  ~80   Wrist flexion 56 ***  Wrist extension 43 ***  (Blank rows = not tested)   Hand AROM Left eval Lt 03/25/24  Full Fist Ability (or Gap to Distal Palmar Crease) Unable due to pain and stiffness   Thumb Opposition  (Kapandji Scale)  4/10 ***/10  (Blank rows = not tested)   UPPER EXTREMITY MMT:    Eval:  NT at eval due to recent and still healing injuries. Will be tested when appropriate.   MMT Left TBD  Elbow flexion   Elbow extension   Forearm supination   Forearm  pronation   Wrist flexion   Wrist extension   Wrist ulnar deviation   Wrist radial deviation   (Blank rows = not tested)  HAND FUNCTION: Eval: Observed weakness in affected Lt hand.  Details TBD when safe Grip strength Right: TBD lbs, Left: TBD lbs   COORDINATION: 03/25/24: 9 Hole Peg Test  Left: *** sec (~25 sec is WFL)   SENSATION: Eval:  Light touch intact today, though diminished around sx area    EDEMA:   Eval:  Mildly swollen in left hand and wrist today, compared to the right hand and wrist  OBSERVATIONS:   Eval: Surgical  sites are well-healing with glue in place in the first dorsal compartment, sutures recently removed in the palm and no signs of dehiscence or infection.  Tenderness and swelling within normal limits for this time postop   Left carpal tunnel release/de Quervain's release   TODAY'S TREATMENT:  03/25/24: *** Review initial HEP and recommendations     Post-evaluation treatment:   For safety/self-care, the patient was recommended to do no pushing/pulling or weightbearing through the left hand or arm now.  The patient was taught to care for the postsurgical wound by doing light touch desensitization, gentle scar mobilizations, keep moist with a Vaseline, keep covered until completely healed.  The patient should not be soaking the wound, either.  The patient can quickly shower and dab dry.  The patient was given a compressive stockinette to help with swelling.  The patient was also given the following home exercise program to perform in a nonpainful fashion approximately 4 times a day.  Each one was reviewed with the patient, with performance back to show understanding and no significant pain.  The patient leaves without any questions or concerns.  Exercises - Standing Radial Nerve Glide  - 4-6 x daily - 1 sets - 10-15 reps - Median Nerve Flossing  - 3 x daily - 5 reps - Turn Palm Facing Up & Down  - 4-6 x daily - 10-15 reps - Bend and Pull Back Wrist SLOWLY  - 4 x daily - 10-15 reps - Windshield Wipers   - 4 x daily - 10-15 reps - Tendon Glides  - 4-6 x daily - 3-5 reps - 2-3 seconds hold - Thumb Opposition  - 4-6 x daily - 10 reps Patient Education - Scar Massage   PATIENT EDUCATION: Education details: See tx section above for details  Person educated: Patient Education method: Verbal Instruction, Teach back, Handouts  Education comprehension: States and demonstrates understanding, Additional Education required    HOME EXERCISE PROGRAM: Access Code: [email or web address] URL:  [email or web address] Date: 03/12/2024 Prepared by: Melvenia Ada   GOALS: Goals reviewed with patient? Yes   SHORT TERM GOALS: (STG required if POC>30 days) Target Date: 03/28/24  Pt will demo/state understanding of initial HEP to improve pain levels and prerequisite motion. Goal status: INITIAL   LONG TERM GOALS: Target Date: 04/18/2024  Pt will improve functional ability by decreased impairment per QuickDASH assessment from 32% to 10% or better, for better quality of life. Goal status: INITIAL  2.  Pt will improve grip strength in left hand from unsafe to test to at least 35 lbs for functional use at home and in IADLs. Goal status: INITIAL  3.  Pt will improve A/ROM in left wrist flexion/extension from 56/43 degrees respectively to at least 60 degrees each, to have functional motion for tasks like reach and grasp.  Goal status: INITIAL  4.  Pt will improve  strength in left wrist flexion/extension from apparent 3 -/5 MMT to at least 4+/5 MMT to have increased functional ability to carry out selfcare and higher-level homecare tasks with less difficulty. Goal status: INITIAL  5.  Pt will improve coordination skills in left hand and arm, as seen by within functional limit score on nine-hole peg testing to have increased functional ability to carry out fine motor tasks (fasteners, etc.) and more complex, coordinated IADLs (meal prep, sports, etc.).  Goal status: INITIAL  6.  Pt will decrease pain at rest from 3/10 to 0/10 or better to have better sleep and occupational participation in daily roles. Goal status: INITIAL   ASSESSMENT:  CLINICAL IMPRESSION: 03/25/24: ***   PLAN:  OT FREQUENCY: 1-2x/week  OT DURATION: 5 weeks through 04/18/2024 and up to 5 total visits as needed   PLANNED INTERVENTIONS: 97535 self care/ADL training, 02889 therapeutic exercise, 97530 therapeutic activity, 97112 neuromuscular re-education, 97140 manual therapy, 97035 ultrasound,  97032 electrical stimulation (manual), 97760 Orthotic Initial, [id number] Orthotic/Prosthetic subsequent, compression bandaging, Dry needling, energy conservation, coping strategies training, and patient/family education  RECOMMENDED OTHER SERVICES: none now    CONSULTED AND AGREED WITH PLAN OF CARE: Patient  PLAN FOR NEXT SESSION:   ***  Melvenia Ada, OTR/L, CHT  03/24/2024, 4:48 PM   "

## 2024-03-25 ENCOUNTER — Encounter: Admitting: Rehabilitative and Restorative Service Providers"

## 2024-04-04 NOTE — Therapy (Signed)
 " OUTPATIENT OCCUPATIONAL THERAPY TREATMENT NOTE   Patient Name: Adam Mcneil MRN: [id number] DOB:1971/03/05, 54 y.o., male Today's Date: 04/04/2024  PCP: N/A REFERRING PROVIDER: Dr. Arlinda   END OF SESSION:    Past Medical History:  Diagnosis Date   Asthma    Migraine    No past surgical history on file. 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 10/25/2021   Wrist arthropathy 06/30/2021   Scapho-lunate dissociation, left 06/30/2021   Ankle effusion, left 12/15/2020   Strain of calf muscle 10/29/2020   Left hip pain 01/30/2020   Quadriceps tendinitis 02/26/2019   Patellar tendinitis of both knees 02/26/2019   CKD (chronic kidney disease), stage II 09/13/2018   Acute pancreatitis    Acute abdominal pain in right upper quadrant 09/12/2018   OA (osteoarthritis) of knee 01/23/2017    ONSET DATE: DOS 02/28/24  REFERRING DIAG:  G56.02 (ICD-10-CM) - Carpal tunnel syndrome, left upper limb  M65.4 (ICD-10-CM) - De Quervain's tenosynovitis, left    THERAPY DIAG:  No diagnosis found.  Rationale for Evaluation and Treatment: Rehabilitation  PERTINENT HISTORY: Pain in the left wrist with paresthesia in the hand leading up to surgical release of the first dorsal compartment and the carpal tunnel He states he is having some pain and stiffness, and he looks forward to being seen in therapy on a semiregular basis as needed.  He makes pain at work, has a lot heavy use of the hand.   PRECAUTIONS: None  RED FLAGS: None   WEIGHT BEARING RESTRICTIONS: Yes no lifting pushing or pulling greater than 1 or 2 pounds for the next 3-4 weeks with the left hand   SUBJECTIVE:   SUBJECTIVE STATEMENT: He is now ~6 weeks s/p Lt CTR and DQR surgeries.  He states ***.     PAIN:  Are you having pain? Yes: NPRS scale: ***/10 at rest now  Pain  location: Left surgical sites and swollen hand Pain description: Aching and sore Aggravating factors: Motion or attempted weightbearing Relieving factors: Rest   PATIENT GOALS: To safely improve the use of his left hand and arm for daily tasks  NEXT MD VISIT: Approximately 4 weeks   OBJECTIVE: (All objective assessments below are from initial evaluation on: 03/12/2024 unless otherwise specified.)   HAND DOMINANCE: Right   ADLs: Overall ADLs: States decreased ability to grab, hold household objects, pain and difficulty to open containers, perform FMS tasks (manipulate fasteners on clothing), mild to moderate bathing problems as well.    FUNCTIONAL OUTCOME MEASURES: Eval: Quick DASH 32% impairment today  (Higher % Score  =  More Impairment)     UPPER EXTREMITY ROM     Shoulder to Wrist AROM Left eval Lt 04/07/24  Forearm supination ~75   Forearm pronation  ~80   Wrist flexion 56 ***  Wrist extension 43 ***  (Blank rows = not tested)   Hand AROM Left eval Lt 04/07/24  Full Fist Ability (or Gap to Distal Palmar Crease) Unable due to pain and stiffness   Thumb Opposition  (Kapandji Scale)  4/10 ***/10  (Blank rows = not tested)   UPPER EXTREMITY MMT:    Eval:  NT at eval due to recent and still healing injuries. Will be tested when appropriate.   MMT Left TBD  Elbow flexion   Elbow extension   Forearm supination   Forearm  pronation   Wrist flexion   Wrist extension   Wrist ulnar deviation   Wrist radial deviation   (Blank rows = not tested)  HAND FUNCTION: Eval: Observed weakness in affected Lt hand.  Details TBD when safe Grip strength Right: TBD lbs, Left: TBD lbs   COORDINATION: 04/07/24: 9 Hole Peg Test  Left: *** sec (~25 sec is WFL)   SENSATION: Eval:  Light touch intact today, though diminished around sx area    EDEMA:   Eval:  Mildly swollen in left hand and wrist today, compared to the right hand and wrist  OBSERVATIONS:   Eval: Surgical sites  are well-healing with glue in place in the first dorsal compartment, sutures recently removed in the palm and no signs of dehiscence or infection.  Tenderness and swelling within normal limits for this time postop   Left carpal tunnel release/de Quervain's release   TODAY'S TREATMENT:  04/07/24: *** Review initial HEP and recommendations     Post-evaluation treatment:   For safety/self-care, the patient was recommended to do no pushing/pulling or weightbearing through the left hand or arm now.  The patient was taught to care for the postsurgical wound by doing light touch desensitization, gentle scar mobilizations, keep moist with a Vaseline, keep covered until completely healed.  The patient should not be soaking the wound, either.  The patient can quickly shower and dab dry.  The patient was given a compressive stockinette to help with swelling.  The patient was also given the following home exercise program to perform in a nonpainful fashion approximately 4 times a day.  Each one was reviewed with the patient, with performance back to show understanding and no significant pain.  The patient leaves without any questions or concerns.  Exercises - Standing Radial Nerve Glide  - 4-6 x daily - 1 sets - 10-15 reps - Median Nerve Flossing  - 3 x daily - 5 reps - Turn Palm Facing Up & Down  - 4-6 x daily - 10-15 reps - Bend and Pull Back Wrist SLOWLY  - 4 x daily - 10-15 reps - Windshield Wipers   - 4 x daily - 10-15 reps - Tendon Glides  - 4-6 x daily - 3-5 reps - 2-3 seconds hold - Thumb Opposition  - 4-6 x daily - 10 reps Patient Education - Scar Massage   PATIENT EDUCATION: Education details: See tx section above for details  Person educated: Patient Education method: Verbal Instruction, Teach back, Handouts  Education comprehension: States and demonstrates understanding, Additional Education required    HOME EXERCISE PROGRAM: Access Code: [email or web address] URL:  [email or web address] Date: 03/12/2024 Prepared by: Melvenia Ada   GOALS: Goals reviewed with patient? Yes   SHORT TERM GOALS: (STG required if POC>30 days) Target Date: 03/28/24  Pt will demo/state understanding of initial HEP to improve pain levels and prerequisite motion. Goal status: INITIAL   LONG TERM GOALS: Target Date: 04/18/2024  Pt will improve functional ability by decreased impairment per QuickDASH assessment from 32% to 10% or better, for better quality of life. Goal status: INITIAL  2.  Pt will improve grip strength in left hand from unsafe to test to at least 35 lbs for functional use at home and in IADLs. Goal status: INITIAL  3.  Pt will improve A/ROM in left wrist flexion/extension from 56/43 degrees respectively to at least 60 degrees each, to have functional motion for tasks like reach and grasp.  Goal status: INITIAL  4.  Pt will improve  strength in left wrist flexion/extension from apparent 3 -/5 MMT to at least 4+/5 MMT to have increased functional ability to carry out selfcare and higher-level homecare tasks with less difficulty. Goal status: INITIAL  5.  Pt will improve coordination skills in left hand and arm, as seen by within functional limit score on nine-hole peg testing to have increased functional ability to carry out fine motor tasks (fasteners, etc.) and more complex, coordinated IADLs (meal prep, sports, etc.).  Goal status: INITIAL  6.  Pt will decrease pain at rest from 3/10 to 0/10 or better to have better sleep and occupational participation in daily roles. Goal status: INITIAL   ASSESSMENT:  CLINICAL IMPRESSION: 04/07/24: ***   PLAN:  OT FREQUENCY: 1-2x/week  OT DURATION: 5 weeks through 04/18/2024 and up to 5 total visits as needed   PLANNED INTERVENTIONS: 97535 self care/ADL training, 02889 therapeutic exercise, 97530 therapeutic activity, 97112 neuromuscular re-education, 97140 manual therapy, 97035 ultrasound,  97032 electrical stimulation (manual), 97760 Orthotic Initial, [id number] Orthotic/Prosthetic subsequent, compression bandaging, Dry needling, energy conservation, coping strategies training, and patient/family education  RECOMMENDED OTHER SERVICES: none now    CONSULTED AND AGREED WITH PLAN OF CARE: Patient  PLAN FOR NEXT SESSION:   ***  Melvenia Ada, OTR/L, CHT  04/04/2024, 9:33 AM   "

## 2024-04-07 ENCOUNTER — Encounter: Payer: Self-pay | Admitting: Rehabilitative and Restorative Service Providers"

## 2024-04-07 ENCOUNTER — Ambulatory Visit: Admitting: Rehabilitative and Restorative Service Providers"

## 2024-04-07 DIAGNOSIS — M25532 Pain in left wrist: Secondary | ICD-10-CM

## 2024-04-07 DIAGNOSIS — M25642 Stiffness of left hand, not elsewhere classified: Secondary | ICD-10-CM | POA: Diagnosis not present

## 2024-04-07 DIAGNOSIS — R278 Other lack of coordination: Secondary | ICD-10-CM

## 2024-04-07 DIAGNOSIS — R600 Localized edema: Secondary | ICD-10-CM

## 2024-04-07 DIAGNOSIS — M79642 Pain in left hand: Secondary | ICD-10-CM | POA: Diagnosis not present

## 2024-04-11 NOTE — Therapy (Incomplete)
 " OUTPATIENT OCCUPATIONAL THERAPY TREATMENT & *** NOTE   Patient Name: Adam Mcneil MRN: [id number] DOB:1970/05/03, 54 y.o., male Today's Date: 04/11/2024  PCP: N/A REFERRING PROVIDER: Dr. Arlinda              ***           END OF SESSION:     Past Medical History:  Diagnosis Date   Asthma    Migraine    No past surgical history on file. 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 10/25/2021   Wrist arthropathy 06/30/2021   Scapho-lunate dissociation, left 06/30/2021   Ankle effusion, left 12/15/2020   Strain of calf muscle 10/29/2020   Left hip pain 01/30/2020   Quadriceps tendinitis 02/26/2019   Patellar tendinitis of both knees 02/26/2019   CKD (chronic kidney disease), stage II 09/13/2018   Acute pancreatitis    Acute abdominal pain in right upper quadrant 09/12/2018   OA (osteoarthritis) of knee 01/23/2017    ONSET DATE: DOS 02/28/24  REFERRING DIAG:  G56.02 (ICD-10-CM) - Carpal tunnel syndrome, left upper limb  M65.4 (ICD-10-CM) - De Quervain's tenosynovitis, left    THERAPY DIAG:  No diagnosis found.  Rationale for Evaluation and Treatment: Rehabilitation  PERTINENT HISTORY: Pain in the left wrist with paresthesia in the hand leading up to surgical release of the first dorsal compartment and the carpal tunnel He states he is having some pain and stiffness, and he looks forward to being seen in therapy on a semiregular basis as needed.  He makes pain at work, has a lot heavy use of the hand.   PRECAUTIONS: None  RED FLAGS: None   WEIGHT BEARING RESTRICTIONS: Yes no lifting pushing or pulling greater than 1 or 2 pounds for the next 3-4 weeks with the left hand   SUBJECTIVE:   SUBJECTIVE STATEMENT: He is now 6+ weeks s/p Lt CTR and DQR surgeries.  He states ***    he was cautious for the  past 4 to 6 weeks as asked, he did not come in the last 3 weeks due to the holidays and scheduling issues.  He looks a bit apprehensive today.SABRA     PAIN:  Are you having pain? Yes: NPRS scale: *** 0/10 at rest now  Pain location: Left surgical sites and swollen hand Pain description: Aching and sore Aggravating factors: Motion or attempted weightbearing Relieving factors: Rest   PATIENT GOALS: To safely improve the use of his left hand and arm for daily tasks  NEXT MD VISIT: Approximately 4 weeks   OBJECTIVE: (All objective assessments below are from initial evaluation on: 03/12/2024 unless otherwise specified.)   HAND DOMINANCE: Right   ADLs: Overall ADLs: States decreased ability to grab, hold household objects, pain and difficulty to open containers, perform FMS tasks (manipulate fasteners on clothing), mild to moderate bathing problems as well.    FUNCTIONAL OUTCOME MEASURES: 04/14/24: Quick DASH: ***%   Eval: Quick DASH 32% impairment today  (Higher % Score  =  More Impairment)     UPPER EXTREMITY ROM     Shoulder to Wrist AROM Left eval Lt 04/07/24 Lt 04/14/24  Forearm supination ~75    Forearm pronation  ~80    Wrist flexion 56 42 ***  Wrist extension 43 22 ***  (Blank rows = not tested)   Hand AROM Left eval Lt 04/07/24  Full Fist Ability (or Gap to Distal Palmar Crease) Unable due to pain and stiffness Full fist  Thumb Opposition  (Kapandji Scale)  4/10 6/10  (Blank rows = not tested)   UPPER EXTREMITY MMT:    Eval:  NT at eval due to recent and still healing injuries. Will be tested when appropriate.   MMT Left TBD  Elbow flexion   Elbow extension   Forearm supination   Forearm pronation   Wrist flexion   Wrist extension   Wrist ulnar deviation   Wrist radial deviation   (Blank rows = not tested)  HAND FUNCTION: 04/14/24: Grip Lt: ***#    04/07/24: Grip strength Right: 74 lbs, Left: 39 lbs   COORDINATION: 04/14/24: 9 Hole Peg Test  Left:  *** sec (~25 sec is WFL)   SENSATION: Eval:  Light touch intact today, though diminished around sx area    EDEMA:   Eval:  Mildly swollen in left hand and wrist today, compared to the right hand and wrist  OBSERVATIONS:   Eval: Surgical sites are well-healing with glue in place in the first dorsal compartment, sutures recently removed in the palm and no signs of dehiscence or infection.  Tenderness and swelling within normal limits for this time postop   Left carpal tunnel release/de Quervain's release   TODAY'S TREATMENT:  04/14/24: ***  Upgrade to light wrist strengthening, take nine-hole peg test, promote functional activity and ability  04/07/24: He starts with active range of motion for exercise as well as new measures which shows tighter at the wrist now, possibly due to guarding and disuse of the hand and arm.  OT does review that he was told that light activities are good and healthy for him, just to avoid heavy or repetitive pushing pulling gripping etc.  Now that he is approximately 6 weeks postop, he can begin gripping and pinching as tolerated.  OT upgrades his home exercise program to the list below, bolded exercises/activities are new.  They include new stretches, also grip training.  He tolerates all these well.  He does have some shaking through the small finger and ring finger which could be due to some ulnar nerve impingement.  He is educated on ulnar nerve glides to help with that.  He states understanding all directions today, and that he should progressively do more activities at home as long as they are not painful.   Exercises/activities performed and reviewed today - Standing Radial Nerve Glide  - 4-6 x daily - 1 sets - 10-15 reps - Median Nerve Flossing  - 3 x daily - 5 reps - Ulnar Nerve Flossing  - 1-2 x daily - 5 reps - Wrist Flexion Stretch  - 4 x daily - 5 reps - 15 sec hold - Wrist Prayer Stretch  - 4 x daily - 5 reps - 15 sec hold - Stretch thumb toward base of  small finger (put hand in LAP)  - 2-3 x daily - 3-5 reps - 15 sec hold - Bend and Pull Back Wrist SLOWLY  - 4 x daily - 5-10 reps - Windshield Wipers   - 4 x daily - 5-10 reps - Tendon Glides  - 4-6 x daily - 3-5 reps - 2-3 seconds hold - Thumb Opposition  - 4-6 x daily - 10 reps - Towel Roll Grip with Forearm in Neutral  - 3 x daily - 5 reps - 10 sec hold  Patient Education - Scar Massage   PATIENT EDUCATION: Education  details: See tx section above for details  Person educated: Patient Education method: Verbal Instruction, Teach back, Handouts  Education comprehension: States and demonstrates understanding, Additional Education required    HOME EXERCISE PROGRAM: Access Code: [email or web address] URL: [email or web address] Date: 03/12/2024 Prepared by: Melvenia Ada   GOALS: Goals reviewed with patient? Yes   SHORT TERM GOALS: (STG required if POC>30 days) Target Date: 03/28/24  Pt will demo/state understanding of initial HEP to improve pain levels and prerequisite motion. Goal status: 04/14/24: ***   LONG TERM GOALS: Target Date: 04/18/2024  Pt will improve functional ability by decreased impairment per QuickDASH assessment from 32% to 10% or better, for better quality of life. Goal status: 04/14/24: ***  2.  Pt will improve grip strength in left hand from unsafe to test to at least 35 lbs for functional use at home and in IADLs. Goal status: 04/14/24: ***  3.  Pt will improve A/ROM in left wrist flexion/extension from 56/43 degrees respectively to at least 60 degrees each, to have functional motion for tasks like reach and grasp.  Goal status: 04/14/24: ***  4.  Pt will improve strength in left wrist flexion/extension from apparent 3 -/5 MMT to at least 4+/5 MMT to have increased functional ability to carry out selfcare and higher-level homecare tasks with less difficulty. Goal status: 04/14/24: ***  5.  Pt will improve coordination skills in left hand and arm, as  seen by within functional limit score on nine-hole peg testing to have increased functional ability to carry out fine motor tasks (fasteners, etc.) and more complex, coordinated IADLs (meal prep, sports, etc.).  Goal status: 04/14/24: ***  6.  Pt will decrease pain at rest from 3/10 to 0/10 or better to have better sleep and occupational participation in daily roles. Goal status: 04/14/24: ***   ASSESSMENT:  CLINICAL IMPRESSION: 04/14/24: ***  04/07/24: He seems to have been holding himself back a bit, because his wrist motion is actually more stiff now.  He is a bit timid to begin grip training or stretches.  OT does assure him that he needs to get moving with light functional activities, stretching and gripping.  Next week we will try to upgrade to light wrist strengthening   PLAN:  OT FREQUENCY: 1-2x/week  OT DURATION: 5 weeks through 04/18/2024 and up to 5 total visits as needed   PLANNED INTERVENTIONS: 97535 self care/ADL training, 02889 therapeutic exercise, 97530 therapeutic activity, 97112 neuromuscular re-education, 97140 manual therapy, 97035 ultrasound, 97032 electrical stimulation (manual), 97760 Orthotic Initial, [id number] Orthotic/Prosthetic subsequent, compression bandaging, Dry needling, energy conservation, coping strategies training, and patient/family education  RECOMMENDED OTHER SERVICES: none now    CONSULTED AND AGREED WITH PLAN OF CARE: Patient  PLAN FOR NEXT SESSION:   ***  Melvenia Ada, OTR/L, CHT  04/11/2024, 10:49 AM   "

## 2024-04-14 ENCOUNTER — Encounter: Admitting: Rehabilitative and Restorative Service Providers"

## 2024-04-14 NOTE — Therapy (Incomplete)
 " OUTPATIENT OCCUPATIONAL THERAPY TREATMENT & *** NOTE   Patient Name: Adam Mcneil MRN: [id number] DOB:23-Sep-1970, 54 y.o., male Today's Date: 04/14/2024  PCP: N/A REFERRING PROVIDER: Dr. Arlinda              ***           END OF SESSION:     Past Medical History:  Diagnosis Date   Asthma    Migraine    No past surgical history on file. 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 10/25/2021   Wrist arthropathy 06/30/2021   Scapho-lunate dissociation, left 06/30/2021   Ankle effusion, left 12/15/2020   Strain of calf muscle 10/29/2020   Left hip pain 01/30/2020   Quadriceps tendinitis 02/26/2019   Patellar tendinitis of both knees 02/26/2019   CKD (chronic kidney disease), stage II 09/13/2018   Acute pancreatitis    Acute abdominal pain in right upper quadrant 09/12/2018   OA (osteoarthritis) of knee 01/23/2017    ONSET DATE: DOS 02/28/24  REFERRING DIAG:  G56.02 (ICD-10-CM) - Carpal tunnel syndrome, left upper limb  M65.4 (ICD-10-CM) - De Quervain's tenosynovitis, left    THERAPY DIAG:  No diagnosis found.  Rationale for Evaluation and Treatment: Rehabilitation  PERTINENT HISTORY: Pain in the left wrist with paresthesia in the hand leading up to surgical release of the first dorsal compartment and the carpal tunnel He states he is having some pain and stiffness, and he looks forward to being seen in therapy on a semiregular basis as needed.  He makes pain at work, has a lot heavy use of the hand.   PRECAUTIONS: None  RED FLAGS: None   WEIGHT BEARING RESTRICTIONS: Yes no lifting pushing or pulling greater than 1 or 2 pounds for the next 3-4 weeks with the left hand   SUBJECTIVE:   SUBJECTIVE STATEMENT: He is now 6+ weeks s/p Lt CTR and DQR surgeries.  He states ***    he was cautious for the  past 4 to 6 weeks as asked, he did not come in the last 3 weeks due to the holidays and scheduling issues.  He looks a bit apprehensive today.SABRA     PAIN:  Are you having pain? Yes: NPRS scale: *** 0/10 at rest now  Pain location: Left surgical sites and swollen hand Pain description: Aching and sore Aggravating factors: Motion or attempted weightbearing Relieving factors: Rest   PATIENT GOALS: To safely improve the use of his left hand and arm for daily tasks  NEXT MD VISIT: Approximately 4 weeks   OBJECTIVE: (All objective assessments below are from initial evaluation on: 03/12/2024 unless otherwise specified.)   HAND DOMINANCE: Right   ADLs: Overall ADLs: States decreased ability to grab, hold household objects, pain and difficulty to open containers, perform FMS tasks (manipulate fasteners on clothing), mild to moderate bathing problems as well.    FUNCTIONAL OUTCOME MEASURES: 04/16/24: Quick DASH: ***%   Eval: Quick DASH 32% impairment today  (Higher % Score  =  More Impairment)     UPPER EXTREMITY ROM     Shoulder to Wrist AROM Left eval Lt 04/07/24 Lt 04/16/24  Forearm supination ~75    Forearm pronation  ~80    Wrist flexion 56 42 ***  Wrist extension 43 22 ***  (Blank rows = not tested)   Hand AROM Left eval Lt 04/07/24  Full Fist Ability (or Gap to Distal Palmar Crease) Unable due to pain and stiffness Full fist  Thumb Opposition  (Kapandji Scale)  4/10 6/10  (Blank rows = not tested)   UPPER EXTREMITY MMT:    Eval:  NT at eval due to recent and still healing injuries. Will be tested when appropriate.   MMT Left 04/16/24  Elbow flexion   Elbow extension   Forearm supination   Forearm pronation   Wrist flexion ***/5  Wrist extension ***/5  Wrist ulnar deviation   Wrist radial deviation   (Blank rows = not tested)  HAND FUNCTION: 04/16/24: Grip Lt: ***#    04/07/24: Grip strength Right: 74 lbs, Left: 39 lbs   COORDINATION: 04/16/24: 9 Hole Peg  Test  Left: *** sec (~25 sec is WFL)   SENSATION: Eval:  Light touch intact today, though diminished around sx area    EDEMA:   Eval:  Mildly swollen in left hand and wrist today, compared to the right hand and wrist  OBSERVATIONS:   Eval: Surgical sites are well-healing with glue in place in the first dorsal compartment, sutures recently removed in the palm and no signs of dehiscence or infection.  Tenderness and swelling within normal limits for this time postop   Left carpal tunnel release/de Quervain's release   TODAY'S TREATMENT:  04/16/24: ***  Upgrade to light wrist strengthening, take nine-hole peg test, promote functional activity and ability  04/07/24: He starts with active range of motion for exercise as well as new measures which shows tighter at the wrist now, possibly due to guarding and disuse of the hand and arm.  OT does review that he was told that light activities are good and healthy for him, just to avoid heavy or repetitive pushing pulling gripping etc.  Now that he is approximately 6 weeks postop, he can begin gripping and pinching as tolerated.  OT upgrades his home exercise program to the list below, bolded exercises/activities are new.  They include new stretches, also grip training.  He tolerates all these well.  He does have some shaking through the small finger and ring finger which could be due to some ulnar nerve impingement.  He is educated on ulnar nerve glides to help with that.  He states understanding all directions today, and that he should progressively do more activities at home as long as they are not painful.   Exercises/activities performed and reviewed today - Standing Radial Nerve Glide  - 4-6 x daily - 1 sets - 10-15 reps - Median Nerve Flossing  - 3 x daily - 5 reps - Ulnar Nerve Flossing  - 1-2 x daily - 5 reps - Wrist Flexion Stretch  - 4 x daily - 5 reps - 15 sec hold - Wrist Prayer Stretch  - 4 x daily - 5 reps - 15 sec hold - Stretch thumb  toward base of small finger (put hand in LAP)  - 2-3 x daily - 3-5 reps - 15 sec hold - Bend and Pull Back Wrist SLOWLY  - 4 x daily - 5-10 reps - Windshield Wipers   - 4 x daily - 5-10 reps - Tendon Glides  - 4-6 x daily - 3-5 reps - 2-3 seconds hold - Thumb Opposition  - 4-6 x daily - 10 reps - Towel Roll Grip with Forearm in Neutral  - 3 x daily - 5 reps - 10 sec hold  Patient Education - Scar Massage   PATIENT EDUCATION: Education  details: See tx section above for details  Person educated: Patient Education method: Verbal Instruction, Teach back, Handouts  Education comprehension: States and demonstrates understanding, Additional Education required    HOME EXERCISE PROGRAM: Access Code: [email or web address] URL: [email or web address] Date: 03/12/2024 Prepared by: Melvenia Ada   GOALS: Goals reviewed with patient? Yes   SHORT TERM GOALS: (STG required if POC>30 days) Target Date: 03/28/24  Pt will demo/state understanding of initial HEP to improve pain levels and prerequisite motion. Goal status: 04/16/24: ***   LONG TERM GOALS: Target Date: 04/18/2024  Pt will improve functional ability by decreased impairment per QuickDASH assessment from 32% to 10% or better, for better quality of life. Goal status: 04/16/24: ***  2.  Pt will improve grip strength in left hand from unsafe to test to at least 35 lbs for functional use at home and in IADLs. Goal status: 04/16/24: ***  3.  Pt will improve A/ROM in left wrist flexion/extension from 56/43 degrees respectively to at least 60 degrees each, to have functional motion for tasks like reach and grasp.  Goal status: 04/16/24: ***  4.  Pt will improve strength in left wrist flexion/extension from apparent 3 -/5 MMT to at least 4+/5 MMT to have increased functional ability to carry out selfcare and higher-level homecare tasks with less difficulty. Goal status: 04/16/24: ***  5.  Pt will improve coordination skills in left  hand and arm, as seen by within functional limit score on nine-hole peg testing to have increased functional ability to carry out fine motor tasks (fasteners, etc.) and more complex, coordinated IADLs (meal prep, sports, etc.).  Goal status: 04/16/24: ***  6.  Pt will decrease pain at rest from 3/10 to 0/10 or better to have better sleep and occupational participation in daily roles. Goal status: 04/16/24: ***   ASSESSMENT:  CLINICAL IMPRESSION: 04/16/24: ***  04/07/24: He seems to have been holding himself back a bit, because his wrist motion is actually more stiff now.  He is a bit timid to begin grip training or stretches.  OT does assure him that he needs to get moving with light functional activities, stretching and gripping.  Next week we will try to upgrade to light wrist strengthening   PLAN:  OT FREQUENCY: 1-2x/week  OT DURATION: 5 weeks through 04/18/2024 and up to 5 total visits as needed   PLANNED INTERVENTIONS: 97535 self care/ADL training, 02889 therapeutic exercise, 97530 therapeutic activity, 97112 neuromuscular re-education, 97140 manual therapy, 97035 ultrasound, 97032 electrical stimulation (manual), 97760 Orthotic Initial, [id number] Orthotic/Prosthetic subsequent, compression bandaging, Dry needling, energy conservation, coping strategies training, and patient/family education  RECOMMENDED OTHER SERVICES: none now    CONSULTED AND AGREED WITH PLAN OF CARE: Patient  PLAN FOR NEXT SESSION:   ***  Melvenia Ada, OTR/L, CHT  04/14/2024, 12:53 PM   "

## 2024-04-16 ENCOUNTER — Encounter: Admitting: Rehabilitative and Restorative Service Providers"

## 2024-04-16 ENCOUNTER — Telehealth: Payer: Self-pay | Admitting: Rehabilitative and Restorative Service Providers"

## 2024-04-16 NOTE — Telephone Encounter (Signed)
OT called patient to discuss missed appointment. OT left message with pt about next appointment date/time. They were reminded of the attendance policy and future missed appointments could lead to early discharge from therapy.

## 2024-04-17 NOTE — Therapy (Incomplete)
 " OUTPATIENT OCCUPATIONAL THERAPY TREATMENT & *** NOTE   Patient Name: Adam Mcneil MRN: [id number] DOB:[birth year], 54 y.o., male Today's Date: 04/17/2024  PCP: N/A REFERRING PROVIDER: Dr. Arlinda              ***           END OF SESSION:     Past Medical History:  Diagnosis Date   Asthma    Migraine    No past surgical history on file. 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 10/25/2021   Wrist arthropathy 06/30/2021   Scapho-lunate dissociation, left 06/30/2021   Ankle effusion, left 12/15/2020   Strain of calf muscle 10/29/2020   Left hip pain 01/30/2020   Quadriceps tendinitis 02/26/2019   Patellar tendinitis of both knees 02/26/2019   CKD (chronic kidney disease), stage II 09/13/2018   Acute pancreatitis    Acute abdominal pain in right upper quadrant 09/12/2018   OA (osteoarthritis) of knee 01/23/2017    ONSET DATE: DOS 02/28/24  REFERRING DIAG:  G56.02 (ICD-10-CM) - Carpal tunnel syndrome, left upper limb  M65.4 (ICD-10-CM) - De Quervain's tenosynovitis, left    THERAPY DIAG:  No diagnosis found.  Rationale for Evaluation and Treatment: Rehabilitation  PERTINENT HISTORY: Pain in the left wrist with paresthesia in the hand leading up to surgical release of the first dorsal compartment and the carpal tunnel He states he is having some pain and stiffness, and he looks forward to being seen in therapy on a semiregular basis as needed.  He makes pain at work, has a lot heavy use of the hand.   PRECAUTIONS: None  RED FLAGS: None   WEIGHT BEARING RESTRICTIONS: Yes no lifting pushing or pulling greater than 1 or 2 pounds for the next 3-4 weeks with the left hand   SUBJECTIVE:   SUBJECTIVE STATEMENT: He is now 7+ weeks s/p Lt CTR and DQR surgeries.  He states ***    he was cautious for the  past 4 to 6 weeks as asked, he did not come in the last 3 weeks due to the holidays and scheduling issues.  He looks a bit apprehensive today.SABRA     PAIN:  Are you having pain? Yes: NPRS scale: *** 0/10 at rest now  Pain location: Left surgical sites and swollen hand Pain description: Aching and sore Aggravating factors: Motion or attempted weightbearing Relieving factors: Rest   PATIENT GOALS: To safely improve the use of his left hand and arm for daily tasks  NEXT MD VISIT: Approximately 4 weeks   OBJECTIVE: (All objective assessments below are from initial evaluation on: 03/12/2024 unless otherwise specified.)   HAND DOMINANCE: Right   ADLs: Overall ADLs: States decreased ability to grab, hold household objects, pain and difficulty to open containers, perform FMS tasks (manipulate fasteners on clothing), mild to moderate bathing problems as well.    FUNCTIONAL OUTCOME MEASURES: 04/21/24: Quick DASH: ***%   Eval: Quick DASH 32% impairment today  (Higher % Score  =  More Impairment)     UPPER EXTREMITY ROM     Shoulder to Wrist AROM Left eval Lt 04/07/24 Lt 04/21/24  Forearm supination ~75    Forearm pronation  ~80    Wrist flexion 56 42 ***  Wrist extension 43 22 ***  (Blank rows = not tested)   Hand AROM Left eval Lt 04/07/24  Full Fist Ability (or Gap to Distal Palmar Crease) Unable due to pain and stiffness Full fist  Thumb Opposition  (Kapandji Scale)  4/10 6/10  (Blank rows = not tested)   UPPER EXTREMITY MMT:    Eval:  NT at eval due to recent and still healing injuries. Will be tested when appropriate.   MMT Left 04/21/24  Elbow flexion   Elbow extension   Forearm supination   Forearm pronation   Wrist flexion ***/5  Wrist extension ***/5  Wrist ulnar deviation   Wrist radial deviation   (Blank rows = not tested)  HAND FUNCTION: 04/21/24: Grip Lt: ***#    04/07/24: Grip strength Right: 74 lbs, Left: 39 lbs   COORDINATION: 04/21/24: 9 Hole Peg  Test  Left: *** sec (~25 sec is WFL)   SENSATION: Eval:  Light touch intact today, though diminished around sx area    EDEMA:   Eval:  Mildly swollen in left hand and wrist today, compared to the right hand and wrist  OBSERVATIONS:   Eval: Surgical sites are well-healing with glue in place in the first dorsal compartment, sutures recently removed in the palm and no signs of dehiscence or infection.  Tenderness and swelling within normal limits for this time postop   Left carpal tunnel release/de Quervain's release   TODAY'S TREATMENT:  04/21/24: ***  Upgrade to light wrist strengthening, take nine-hole peg test, promote functional activity and ability  04/07/24: He starts with active range of motion for exercise as well as new measures which shows tighter at the wrist now, possibly due to guarding and disuse of the hand and arm.  OT does review that he was told that light activities are good and healthy for him, just to avoid heavy or repetitive pushing pulling gripping etc.  Now that he is approximately 6 weeks postop, he can begin gripping and pinching as tolerated.  OT upgrades his home exercise program to the list below, bolded exercises/activities are new.  They include new stretches, also grip training.  He tolerates all these well.  He does have some shaking through the small finger and ring finger which could be due to some ulnar nerve impingement.  He is educated on ulnar nerve glides to help with that.  He states understanding all directions today, and that he should progressively do more activities at home as long as they are not painful.   Exercises/activities performed and reviewed today - Standing Radial Nerve Glide  - 4-6 x daily - 1 sets - 10-15 reps - Median Nerve Flossing  - 3 x daily - 5 reps - Ulnar Nerve Flossing  - 1-2 x daily - 5 reps - Wrist Flexion Stretch  - 4 x daily - 5 reps - 15 sec hold - Wrist Prayer Stretch  - 4 x daily - 5 reps - 15 sec hold - Stretch thumb  toward base of small finger (put hand in LAP)  - 2-3 x daily - 3-5 reps - 15 sec hold - Bend and Pull Back Wrist SLOWLY  - 4 x daily - 5-10 reps - Windshield Wipers   - 4 x daily - 5-10 reps - Tendon Glides  - 4-6 x daily - 3-5 reps - 2-3 seconds hold - Thumb Opposition  - 4-6 x daily - 10 reps - Towel Roll Grip with Forearm in Neutral  - 3 x daily - 5 reps - 10 sec hold  Patient Education - Scar Massage   PATIENT EDUCATION: Education  details: See tx section above for details  Person educated: Patient Education method: Verbal Instruction, Teach back, Handouts  Education comprehension: States and demonstrates understanding, Additional Education required    HOME EXERCISE PROGRAM: Access Code: [email or web address] URL: [email or web address] Date: 03/12/2024 Prepared by: Melvenia Ada   GOALS: Goals reviewed with patient? Yes   SHORT TERM GOALS: (STG required if POC>30 days) Target Date: 03/28/24  Pt will demo/state understanding of initial HEP to improve pain levels and prerequisite motion. Goal status: 04/21/24: ***   LONG TERM GOALS: Target Date: 04/18/2024  Pt will improve functional ability by decreased impairment per QuickDASH assessment from 32% to 10% or better, for better quality of life. Goal status: 04/21/24: ***  2.  Pt will improve grip strength in left hand from unsafe to test to at least 35 lbs for functional use at home and in IADLs. Goal status: 04/21/24: ***  3.  Pt will improve A/ROM in left wrist flexion/extension from 56/43 degrees respectively to at least 60 degrees each, to have functional motion for tasks like reach and grasp.  Goal status: 04/21/24: ***  4.  Pt will improve strength in left wrist flexion/extension from apparent 3 -/5 MMT to at least 4+/5 MMT to have increased functional ability to carry out selfcare and higher-level homecare tasks with less difficulty. Goal status: 04/21/24: ***  5.  Pt will improve coordination skills in left  hand and arm, as seen by within functional limit score on nine-hole peg testing to have increased functional ability to carry out fine motor tasks (fasteners, etc.) and more complex, coordinated IADLs (meal prep, sports, etc.).  Goal status: 04/21/24: ***  6.  Pt will decrease pain at rest from 3/10 to 0/10 or better to have better sleep and occupational participation in daily roles. Goal status: 04/21/24: ***   ASSESSMENT:  CLINICAL IMPRESSION: 04/21/24: ***  04/07/24: He seems to have been holding himself back a bit, because his wrist motion is actually more stiff now.  He is a bit timid to begin grip training or stretches.  OT does assure him that he needs to get moving with light functional activities, stretching and gripping.  Next week we will try to upgrade to light wrist strengthening   PLAN:  OT FREQUENCY: 1-2x/week  OT DURATION: 1 additional week from 04/18/2024 - 04/21/24 and up to *** total visits as needed   PLANNED INTERVENTIONS: 97535 self care/ADL training, 02889 therapeutic exercise, 97530 therapeutic activity, 97112 neuromuscular re-education, 97140 manual therapy, 97035 ultrasound, 97032 electrical stimulation (manual), 97760 Orthotic Initial, [id number] Orthotic/Prosthetic subsequent, compression bandaging, Dry needling, energy conservation, coping strategies training, and patient/family education  RECOMMENDED OTHER SERVICES: none now    CONSULTED AND AGREED WITH PLAN OF CARE: Patient  PLAN FOR NEXT SESSION:   ***  Melvenia Ada, OTR/L, CHT  04/17/2024, 9:21 AM   "

## 2024-04-21 ENCOUNTER — Encounter: Admitting: Rehabilitative and Restorative Service Providers"

## 2024-04-22 NOTE — Therapy (Signed)
 " OUTPATIENT OCCUPATIONAL THERAPY TREATMENT & progress NOTE   Patient Name: Adam Mcneil MRN: [id number] DOB:May 04, 1970, 54 y.o., male Today's Date: 04/23/2024  PCP: N/A REFERRING PROVIDER: Dr. Arlinda              Progress Note Reporting Period 03/12/24 to 03/2824  CLINICAL IMPRESSION: 04/23/24: He has not been coming to therapy consistently, he cancels multiple appointments.  He admits to feeling a pop, feeling pain, and stopping his at home exercise routine.  These are all negative factors and he was counseled on these today.  He states he will come to therapy once a week if I request additional authorization today.  OT states he must start doing his home exercises 4 or 5 times a day.  PLAN:  OT FREQUENCY: 1x/week  OT DURATION: 5 additional weeks from 04/18/2024 - 05/23/24 and up to 7 total visits as needed   See note below for Objective Data and Assessment of Progress/Goals.               END OF SESSION:  OT End of Session - 04/23/24 0846     Visit Number 3    Number of Visits 5    Date for Recertification  05/23/24    Authorization Type [employer]    OT Start Time 0845    OT Stop Time 0923    OT Time Calculation (min) 38 min    Activity Tolerance Patient tolerated treatment well;No increased pain;Patient limited by fatigue;Patient limited by pain    Behavior During Therapy [facility] for tasks assessed/performed            Past Medical History:  Diagnosis Date   Asthma    Migraine    History reviewed. No pertinent surgical history. 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 10/25/2021   Wrist arthropathy 06/30/2021   Scapho-lunate dissociation, left 06/30/2021   Ankle effusion, left 12/15/2020   Strain of calf muscle 10/29/2020   Left hip pain 01/30/2020   Quadriceps tendinitis 02/26/2019   Patellar  tendinitis of both knees 02/26/2019   CKD (chronic kidney disease), stage II 09/13/2018   Acute pancreatitis    Acute abdominal pain in right upper quadrant 09/12/2018   OA (osteoarthritis) of knee 01/23/2017    ONSET DATE: DOS 02/28/24  REFERRING DIAG:  G56.02 (ICD-10-CM) - Carpal tunnel syndrome, left upper limb  M65.4 (ICD-10-CM) - De Quervain's tenosynovitis, left    THERAPY DIAG:  Pain in left hand  Localized edema  Pain in left wrist  Stiffness of left hand, not elsewhere classified  Other lack of coordination  Rationale for Evaluation and Treatment: Rehabilitation  PERTINENT HISTORY: Pain in the left wrist with paresthesia in the hand leading up to surgical release of the first dorsal compartment and the carpal tunnel He states he is having some pain and stiffness, and he looks forward to being seen in therapy on a semiregular basis as needed.  He makes pain at work, has a lot heavy use of the hand.   PRECAUTIONS: None  RED FLAGS: None   WEIGHT BEARING RESTRICTIONS: Yes no lifting pushing or pulling greater than 1 or 2 pounds for the next 3-4 weeks with the left hand   SUBJECTIVE:   SUBJECTIVE STATEMENT: He is now 7+ weeks s/p Lt CTR and DQR surgeries.  He states about 3 days after last appointment, he  flet a pop in his Lt DQR area, and now has burning sensation.  He has not been coming into therapy consistently and he admits to stopping his exercises.     PAIN:  Are you having pain? Yes: NPRS scale: 5-6/10 at rest now  Pain location: Left surgical sites and swollen hand Pain description: Aching and sore Aggravating factors: Motion or attempted weightbearing Relieving factors: Rest   PATIENT GOALS: To safely improve the use of his left hand and arm for daily tasks  NEXT MD VISIT: Approximately 4 weeks   OBJECTIVE: (All objective assessments below are from initial evaluation on: 03/12/2024 unless otherwise specified.)   HAND DOMINANCE: Right    ADLs: Overall ADLs: States decreased ability to grab, hold household objects, pain and difficulty to open containers, perform FMS tasks (manipulate fasteners on clothing), mild to moderate bathing problems as well.    FUNCTIONAL OUTCOME MEASURES: 04/23/24: Quick DASH: NT but assumed worse now%   Eval: Quick DASH 32% impairment today  (Higher % Score  =  More Impairment)     UPPER EXTREMITY ROM     Shoulder to Wrist AROM Left eval Lt 04/07/24 Lt 04/23/24  Forearm supination ~75    Forearm pronation  ~80    Wrist flexion 56 42 54  Wrist extension 43 22 39  (Blank rows = not tested)   Hand AROM Left eval Lt 04/07/24  Full Fist Ability (or Gap to Distal Palmar Crease) Unable due to pain and stiffness Full fist  Thumb Opposition  (Kapandji Scale)  4/10 6/10  (Blank rows = not tested)   UPPER EXTREMITY MMT:    Eval:  NT at eval due to recent and still healing injuries. Will be tested when appropriate.   MMT Left 04/23/24  Elbow flexion   Elbow extension   Forearm supination   Forearm pronation   Wrist flexion   Wrist extension   Wrist ulnar deviation   Wrist radial deviation   (Blank rows = not tested)  HAND FUNCTION: 04/23/24: Grip Lt: 33#    04/07/24: Grip strength Right: 74 lbs, Left: 39 lbs   COORDINATION: 04/23/24: 9 Hole Peg Test  Left: 42 sec (~25 sec is WFL)   SENSATION: Eval:  Light touch intact today, though diminished around sx area    EDEMA:   Eval:  Mildly swollen in left hand and wrist today, compared to the right hand and wrist  OBSERVATIONS:   Eval: Surgical sites are well-healing with glue in place in the first dorsal compartment, sutures recently removed in the palm and no signs of dehiscence or infection.  Tenderness and swelling within normal limits for this time postop   Left carpal tunnel release/de Quervain's release   TODAY'S TREATMENT:  04/23/24: He starts with active range of motion for exercise as well as new measures which shows  that he is now stiffer and weaker than 2 weeks postoperatively.  This is obviously because of a lack of exercise, and he admits to stopping all of his exercises and activities because he was nervous about the pop that he felt.  OT strongly encourages him to do the HEP including massage, stretches, heat, new in hand manipulation skills, radial nerve gliding.  We review all these things, OT also doing manual therapy IASTM through the surgical area.  He states all of these things feel good and he feels better at the end of the session.  OT will have to request new therapy, but OT also asks him to  come to his scheduled appointments because he has not been consistently attending therapy sessions.     In-Hand Manipulation Skills Rotation:  Hold pen, try to twirl like a baton, keeping parallel (or flat) with surface of table. Try going BOTH directions 10x  Flip:  Hold pen in writing position,  flip in an arch to erase position, then back to write position. Do not lift hand off table.  10x  Translation:  Open hand palm up,  put an object in your palm and then use your fingers and thumb to move it to the tips of your fingers, pinched against your thumb. (bigger is easier (fat marker), smaller is harder (penny)) 10x  Shift:  Hold pen like a dart, start shifting it forward & backwards from tip to base (like putting a key in a key hole) 10x     Exercises performed/reviewed today: - Standing Radial Nerve Glide  - 4-6 x daily - 1 sets - 10-15 reps - Wrist Flexion Stretch  - 4 x daily - 5 reps - 15 sec hold - Wrist Prayer Stretch  - 4 x daily - 5 reps - 15 sec hold - Stretch thumb toward base of small finger (put hand in LAP)  - 2-3 x daily - 3-5 reps - 15 sec hold - Towel Roll Grip with Forearm in Neutral  - 3 x daily - 5 reps - 10 sec hold Patient Education - Scar Massage   PATIENT EDUCATION: Education details: See tx section above for details  Person educated: Patient Education method:  Verbal Instruction, Teach back, Handouts  Education comprehension: States and demonstrates understanding, Additional Education required    HOME EXERCISE PROGRAM: Access Code: [email or web address] URL: [email or web address] Date: 04/23/2024 Prepared by: Melvenia Ada   GOALS: Goals reviewed with patient? Yes   SHORT TERM GOALS: (STG required if POC>30 days) Target Date: 03/28/24  Pt will demo/state understanding of initial HEP to improve pain levels and prerequisite motion. Goal status: 04/23/24: Goal met   LONG TERM GOALS: Target Date: 04/18/2024  Pt will improve functional ability by decreased impairment per QuickDASH assessment from 32% to 10% or better, for better quality of life. Goal status: 04/23/24: Not tested and not met because of poor HEP compliance  2.  Pt will improve grip strength in left hand from unsafe to test to at least 35 lbs for functional use at home and in IADLs. Goal status: 04/23/24: Not met and worse  3.  Pt will improve A/ROM in left wrist flexion/extension from 56/43 degrees respectively to at least 60 degrees each, to have functional motion for tasks like reach and grasp.  Goal status: 04/23/24: Not met  4.  Pt will improve strength in left wrist flexion/extension from apparent 3 -/5 MMT to at least 4+/5 MMT to have increased functional ability to carry out selfcare and higher-level homecare tasks with less difficulty. Goal status: 04/23/24: Not met needs more time  5.  Pt will improve coordination skills in left hand and arm, as seen by within functional limit score on nine-hole peg testing to have increased functional ability to carry out fine motor tasks (fasteners, etc.) and more complex, coordinated IADLs (meal prep, sports, etc.).  Goal status: 04/23/24: Not met  6.  Pt will decrease pain at rest from 3/10 to 0/10 or better to have better sleep and occupational participation in daily roles. Goal status: 04/23/24: Now  worse   ASSESSMENT:  CLINICAL IMPRESSION: 04/23/24: He has not been coming to therapy consistently, he  cancels multiple appointments.  He admits to feeling a pop, feeling pain, and stopping his at home exercise routine.  These are all negative factors and he was counseled on these today.  He states he will come to therapy once a week if I request additional authorization today.  OT states he must start doing his home exercises 4 or 5 times a day.  PLAN:  OT FREQUENCY: 1x/week  OT DURATION: 5 additional weeks from 04/18/2024 - 05/23/24 and up to 7 total visits as needed   PLANNED INTERVENTIONS: 97535 self care/ADL training, 02889 therapeutic exercise, 97530 therapeutic activity, 97112 neuromuscular re-education, 97140 manual therapy, 97035 ultrasound, 97032 electrical stimulation (manual), 97760 Orthotic Initial, [id number] Orthotic/Prosthetic subsequent, compression bandaging, Dry needling, energy conservation, coping strategies training, and patient/family education  RECOMMENDED OTHER SERVICES: none now    CONSULTED AND AGREED WITH PLAN OF CARE: Patient  PLAN FOR NEXT SESSION:   Review HEP, upgrade to restrengthening when tolerated likely in 1 to 2 weeks.  Melvenia Ada, OTR/L, CHT  04/23/2024, 9:31 AM   "

## 2024-04-23 ENCOUNTER — Ambulatory Visit: Admitting: Rehabilitative and Restorative Service Providers"

## 2024-04-23 ENCOUNTER — Encounter: Payer: Self-pay | Admitting: Rehabilitative and Restorative Service Providers"

## 2024-04-23 DIAGNOSIS — R600 Localized edema: Secondary | ICD-10-CM

## 2024-04-23 DIAGNOSIS — M79642 Pain in left hand: Secondary | ICD-10-CM | POA: Diagnosis not present

## 2024-04-23 DIAGNOSIS — M25532 Pain in left wrist: Secondary | ICD-10-CM | POA: Diagnosis not present

## 2024-04-23 DIAGNOSIS — M25642 Stiffness of left hand, not elsewhere classified: Secondary | ICD-10-CM

## 2024-04-23 DIAGNOSIS — R278 Other lack of coordination: Secondary | ICD-10-CM | POA: Diagnosis not present

## 2024-04-24 NOTE — Therapy (Incomplete)
 " OUTPATIENT OCCUPATIONAL THERAPY TREATMENT NOTE   Patient Name: Adam Mcneil MRN: [id number] DOB:June 08, 1970, 54 y.o., male Today's Date: 04/24/2024  PCP: N/A REFERRING PROVIDER: Dr. Arlinda      END OF SESSION:      Past Medical History:  Diagnosis Date   Asthma    Migraine    No past surgical history on file. 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 10/25/2021   Wrist arthropathy 06/30/2021   Scapho-lunate dissociation, left 06/30/2021   Ankle effusion, left 12/15/2020   Strain of calf muscle 10/29/2020   Left hip pain 01/30/2020   Quadriceps tendinitis 02/26/2019   Patellar tendinitis of both knees 02/26/2019   CKD (chronic kidney disease), stage II 09/13/2018   Acute pancreatitis    Acute abdominal pain in right upper quadrant 09/12/2018   OA (osteoarthritis) of knee 01/23/2017    ONSET DATE: DOS 02/28/24  REFERRING DIAG:  G56.02 (ICD-10-CM) - Carpal tunnel syndrome, left upper limb  M65.4 (ICD-10-CM) - De Quervain's tenosynovitis, left    THERAPY DIAG:  No diagnosis found.  Rationale for Evaluation and Treatment: Rehabilitation  PERTINENT HISTORY: Pain in the left wrist with paresthesia in the hand leading up to surgical release of the first dorsal compartment and the carpal tunnel He states he is having some pain and stiffness, and he looks forward to being seen in therapy on a semiregular basis as needed.  He makes pain at work, has a lot heavy use of the hand.   PRECAUTIONS: None  RED FLAGS: None   WEIGHT BEARING RESTRICTIONS: Yes no lifting pushing or pulling greater than 1 or 2 pounds for the next 3-4 weeks with the left hand   SUBJECTIVE:   SUBJECTIVE STATEMENT: He is now 8+ weeks s/p Lt CTR and DQR surgeries.  He states ***     PAIN:  Are you having pain? Yes: NPRS scale: *** 5-6/10 at rest  now  Pain location: Left surgical sites and swollen hand Pain description: Aching and sore Aggravating factors: Motion or attempted weightbearing Relieving factors: Rest   PATIENT GOALS: To safely improve the use of his left hand and arm for daily tasks  NEXT MD VISIT: Approximately 4 weeks   OBJECTIVE: (All objective assessments below are from initial evaluation on: 03/12/2024 unless otherwise specified.)   HAND DOMINANCE: Right   ADLs: Overall ADLs: States decreased ability to grab, hold household objects, pain and difficulty to open containers, perform FMS tasks (manipulate fasteners on clothing), mild to moderate bathing problems as well.    FUNCTIONAL OUTCOME MEASURES: 04/23/24: Quick DASH: NT but assumed worse now%   Eval: Quick DASH 32% impairment today  (Higher % Score  =  More Impairment)     UPPER EXTREMITY ROM     Shoulder to Wrist AROM Left eval Lt 04/07/24 Lt 04/23/24 Lt 04/28/24  Forearm supination ~75     Forearm pronation  ~80     Wrist flexion 56 42 54 ***  Wrist extension 43 22 39 ***  (Blank rows = not tested)   Hand AROM Left eval Lt 04/07/24  Full Fist Ability (or Gap to Distal Palmar Crease) Unable due to pain and stiffness Full fist  Thumb Opposition  (Kapandji Scale)  4/10 6/10  (Blank rows = not tested)   UPPER EXTREMITY MMT:    Eval:  NT at eval due to  recent and still healing injuries. Will be tested when appropriate.   MMT Left 04/23/24  Elbow flexion   Elbow extension   Forearm supination   Forearm pronation   Wrist flexion   Wrist extension   Wrist ulnar deviation   Wrist radial deviation   (Blank rows = not tested)  HAND FUNCTION: 04/23/24: Grip Lt: 33#    04/07/24: Grip strength Right: 74 lbs, Left: 39 lbs   COORDINATION: 04/23/24: 9 Hole Peg Test  Left: 42 sec (~25 sec is WFL)   SENSATION: Eval:  Light touch intact today, though diminished around sx area    EDEMA:   Eval:  Mildly swollen in left hand and wrist today,  compared to the right hand and wrist  OBSERVATIONS:   Eval: Surgical sites are well-healing with glue in place in the first dorsal compartment, sutures recently removed in the palm and no signs of dehiscence or infection.  Tenderness and swelling within normal limits for this time postop   Left carpal tunnel release/de Quervain's release   TODAY'S TREATMENT:  04/28/24: *** Review HEP, upgrade to restrengthening when tolerated likely in 1 to 2 weeks.     04/23/24: He starts with active range of motion for exercise as well as new measures which shows that he is now stiffer and weaker than 2 weeks postoperatively.  This is obviously because of a lack of exercise, and he admits to stopping all of his exercises and activities because he was nervous about the pop that he felt.  OT strongly encourages him to do the HEP including massage, stretches, heat, new in hand manipulation skills, radial nerve gliding.  We review all these things, OT also doing manual therapy IASTM through the surgical area.  He states all of these things feel good and he feels better at the end of the session.  OT will have to request new therapy, but OT also asks him to come to his scheduled appointments because he has not been consistently attending therapy sessions.     In-Hand Manipulation Skills Rotation:  Hold pen, try to twirl like a baton, keeping parallel (or flat) with surface of table. Try going BOTH directions 10x  Flip:  Hold pen in writing position,  flip in an arch to erase position, then back to write position. Do not lift hand off table.  10x  Translation:  Open hand palm up,  put an object in your palm and then use your fingers and thumb to move it to the tips of your fingers, pinched against your thumb. (bigger is easier (fat marker), smaller is harder (penny)) 10x  Shift:  Hold pen like a dart, start shifting it forward & backwards from tip to base (like putting a key in a key hole) 10x      Exercises performed/reviewed today: - Standing Radial Nerve Glide  - 4-6 x daily - 1 sets - 10-15 reps - Wrist Flexion Stretch  - 4 x daily - 5 reps - 15 sec hold - Wrist Prayer Stretch  - 4 x daily - 5 reps - 15 sec hold - Stretch thumb toward base of small finger (put hand in LAP)  - 2-3 x daily - 3-5 reps - 15 sec hold - Towel Roll Grip with Forearm in Neutral  - 3 x daily - 5 reps - 10 sec hold Patient Education - Scar Massage   PATIENT EDUCATION: Education details: See tx section above for details  Person educated: Patient Education method: Verbal Instruction,  Teach back, Handouts  Education comprehension: States and demonstrates understanding, Additional Education required    HOME EXERCISE PROGRAM: Access Code: [email or web address] URL: [email or web address] Date: 04/23/2024 Prepared by: Melvenia Ada   GOALS: Goals reviewed with patient? Yes   SHORT TERM GOALS: (STG required if POC>30 days) Target Date: 03/28/24  Pt will demo/state understanding of initial HEP to improve pain levels and prerequisite motion. Goal status: 04/23/24: Goal met   LONG TERM GOALS: Target Date: 05/23/2024  Pt will improve functional ability by decreased impairment per QuickDASH assessment from 32% to 10% or better, for better quality of life. Goal status: 04/23/24: Not tested and not met because of poor HEP compliance  2.  Pt will improve grip strength in left hand from unsafe to test to at least 35 lbs for functional use at home and in IADLs. Goal status: 04/23/24: Not met and worse  3.  Pt will improve A/ROM in left wrist flexion/extension from 56/43 degrees respectively to at least 60 degrees each, to have functional motion for tasks like reach and grasp.  Goal status: 04/23/24: Not met  4.  Pt will improve strength in left wrist flexion/extension from apparent 3 -/5 MMT to at least 4+/5 MMT to have increased functional ability to carry out selfcare and higher-level homecare  tasks with less difficulty. Goal status: 04/23/24: Not met needs more time  5.  Pt will improve coordination skills in left hand and arm, as seen by within functional limit score on nine-hole peg testing to have increased functional ability to carry out fine motor tasks (fasteners, etc.) and more complex, coordinated IADLs (meal prep, sports, etc.).  Goal status: 04/23/24: Not met  6.  Pt will decrease pain at rest from 3/10 to 0/10 or better to have better sleep and occupational participation in daily roles. Goal status: 04/23/24: Now worse   ASSESSMENT:  CLINICAL IMPRESSION: 04/28/24: ***   04/23/24: He has not been coming to therapy consistently, he cancels multiple appointments.  He admits to feeling a pop, feeling pain, and stopping his at home exercise routine.  These are all negative factors and he was counseled on these today.  He states he will come to therapy once a week if I request additional authorization today.  OT states he must start doing his home exercises 4 or 5 times a day.  PLAN:  OT FREQUENCY: 1x/week  OT DURATION: 5 additional weeks from 04/18/2024 - 05/23/24 and up to 7 total visits as needed   PLANNED INTERVENTIONS: 97535 self care/ADL training, 02889 therapeutic exercise, 97530 therapeutic activity, 97112 neuromuscular re-education, 97140 manual therapy, 97035 ultrasound, 97032 electrical stimulation (manual), 97760 Orthotic Initial, [id number] Orthotic/Prosthetic subsequent, compression bandaging, Dry needling, energy conservation, coping strategies training, and patient/family education  CONSULTED AND AGREED WITH PLAN OF CARE: Patient  PLAN FOR NEXT SESSION:   ***  Melvenia Ada, OTR/L, CHT  04/24/2024, 5:04 PM   "

## 2024-04-28 ENCOUNTER — Encounter: Admitting: Rehabilitative and Restorative Service Providers"

## 2024-04-29 ENCOUNTER — Ambulatory Visit: Admitting: Orthopedic Surgery

## 2024-04-29 DIAGNOSIS — G5602 Carpal tunnel syndrome, left upper limb: Secondary | ICD-10-CM

## 2024-04-29 DIAGNOSIS — M654 Radial styloid tenosynovitis [de Quervain]: Secondary | ICD-10-CM

## 2024-04-30 ENCOUNTER — Encounter: Admitting: Rehabilitative and Restorative Service Providers"

## 2024-04-30 ENCOUNTER — Telehealth: Payer: Self-pay | Admitting: Rehabilitative and Restorative Service Providers"

## 2024-04-30 NOTE — Telephone Encounter (Signed)
 OT called patient to discuss missed appointment. OT left message with pt about next appointment date/time. They were reminded of the attendance policy and one more no-show appointment will mean discharge from therapy.

## 2024-05-12 ENCOUNTER — Encounter: Admitting: Rehabilitative and Restorative Service Providers"

## 2024-05-19 ENCOUNTER — Encounter: Admitting: Rehabilitative and Restorative Service Providers"

## 2024-06-10 ENCOUNTER — Ambulatory Visit: Admitting: Orthopedic Surgery
